# Patient Record
Sex: Male | Born: 1950 | Hispanic: Yes | Marital: Married | Smoking: Former smoker
Health system: Southern US, Community
[De-identification: ages and names within clinical notes are randomized; demographics above are authoritative.]

## PROBLEM LIST (undated history)

## (undated) DIAGNOSIS — E119 Type 2 diabetes mellitus without complications: Secondary | ICD-10-CM

## (undated) DIAGNOSIS — Z8639 Personal history of other endocrine, nutritional and metabolic disease: Secondary | ICD-10-CM

## (undated) DIAGNOSIS — I639 Cerebral infarction, unspecified: Secondary | ICD-10-CM

## (undated) DIAGNOSIS — E785 Hyperlipidemia, unspecified: Secondary | ICD-10-CM

## (undated) DIAGNOSIS — I2510 Atherosclerotic heart disease of native coronary artery without angina pectoris: Secondary | ICD-10-CM

## (undated) DIAGNOSIS — N400 Enlarged prostate without lower urinary tract symptoms: Secondary | ICD-10-CM

## (undated) DIAGNOSIS — N184 Chronic kidney disease, stage 4 (severe): Secondary | ICD-10-CM

## (undated) DIAGNOSIS — I10 Essential (primary) hypertension: Secondary | ICD-10-CM

## (undated) DIAGNOSIS — K3184 Gastroparesis: Secondary | ICD-10-CM

## (undated) HISTORY — PX: CORONARY ARTERY BYPASS GRAFT: SHX141

## (undated) HISTORY — PX: ESOPHAGOGASTRODUODENOSCOPY ENDOSCOPY: SHX5814

## (undated) HISTORY — PX: OTHER SURGICAL HISTORY: SHX169

---

## 2020-04-15 ENCOUNTER — Other Ambulatory Visit: Payer: Self-pay

## 2020-04-15 ENCOUNTER — Inpatient Hospital Stay (HOSPITAL_COMMUNITY)
Admission: EM | Admit: 2020-04-15 | Discharge: 2020-04-18 | DRG: 853 | Disposition: A | Discharge status: Home / Self Care | Payer: Medicare Other | Attending: Family Medicine | Admitting: Family Medicine

## 2020-04-15 ENCOUNTER — Encounter (HOSPITAL_COMMUNITY): Payer: Self-pay

## 2020-04-15 DIAGNOSIS — K7400 Hepatic fibrosis, unspecified: Secondary | ICD-10-CM | POA: Diagnosis present

## 2020-04-15 DIAGNOSIS — A419 Sepsis, unspecified organism: Secondary | ICD-10-CM | POA: Diagnosis not present

## 2020-04-15 DIAGNOSIS — N179 Acute kidney failure, unspecified: Secondary | ICD-10-CM | POA: Diagnosis present

## 2020-04-15 DIAGNOSIS — E1165 Type 2 diabetes mellitus with hyperglycemia: Secondary | ICD-10-CM

## 2020-04-15 DIAGNOSIS — Z794 Long term (current) use of insulin: Secondary | ICD-10-CM

## 2020-04-15 DIAGNOSIS — N184 Chronic kidney disease, stage 4 (severe): Secondary | ICD-10-CM | POA: Insufficient documentation

## 2020-04-15 DIAGNOSIS — Z79890 Hormone replacement therapy (postmenopausal): Secondary | ICD-10-CM

## 2020-04-15 DIAGNOSIS — K3580 Unspecified acute appendicitis: Secondary | ICD-10-CM

## 2020-04-15 DIAGNOSIS — R739 Hyperglycemia, unspecified: Secondary | ICD-10-CM

## 2020-04-15 DIAGNOSIS — Z79899 Other long term (current) drug therapy: Secondary | ICD-10-CM

## 2020-04-15 DIAGNOSIS — E119 Type 2 diabetes mellitus without complications: Secondary | ICD-10-CM

## 2020-04-15 DIAGNOSIS — Z20822 Contact with and (suspected) exposure to covid-19: Secondary | ICD-10-CM | POA: Diagnosis present

## 2020-04-15 DIAGNOSIS — I700 Atherosclerosis of aorta: Secondary | ICD-10-CM | POA: Diagnosis present

## 2020-04-15 DIAGNOSIS — G9341 Metabolic encephalopathy: Secondary | ICD-10-CM | POA: Diagnosis present

## 2020-04-15 DIAGNOSIS — E222 Syndrome of inappropriate secretion of antidiuretic hormone: Secondary | ICD-10-CM | POA: Diagnosis present

## 2020-04-15 DIAGNOSIS — I2510 Atherosclerotic heart disease of native coronary artery without angina pectoris: Secondary | ICD-10-CM | POA: Diagnosis present

## 2020-04-15 DIAGNOSIS — E669 Obesity, unspecified: Secondary | ICD-10-CM | POA: Diagnosis present

## 2020-04-15 DIAGNOSIS — I5032 Chronic diastolic (congestive) heart failure: Secondary | ICD-10-CM | POA: Diagnosis present

## 2020-04-15 DIAGNOSIS — Z8673 Personal history of transient ischemic attack (TIA), and cerebral infarction without residual deficits: Secondary | ICD-10-CM

## 2020-04-15 DIAGNOSIS — E1143 Type 2 diabetes mellitus with diabetic autonomic (poly)neuropathy: Secondary | ICD-10-CM | POA: Diagnosis present

## 2020-04-15 DIAGNOSIS — E1122 Type 2 diabetes mellitus with diabetic chronic kidney disease: Secondary | ICD-10-CM | POA: Diagnosis present

## 2020-04-15 DIAGNOSIS — E8881 Metabolic syndrome: Secondary | ICD-10-CM | POA: Diagnosis present

## 2020-04-15 DIAGNOSIS — I255 Ischemic cardiomyopathy: Secondary | ICD-10-CM | POA: Diagnosis present

## 2020-04-15 DIAGNOSIS — K2210 Ulcer of esophagus without bleeding: Secondary | ICD-10-CM | POA: Diagnosis present

## 2020-04-15 DIAGNOSIS — R6520 Severe sepsis without septic shock: Secondary | ICD-10-CM | POA: Diagnosis present

## 2020-04-15 DIAGNOSIS — K219 Gastro-esophageal reflux disease without esophagitis: Secondary | ICD-10-CM

## 2020-04-15 DIAGNOSIS — D6959 Other secondary thrombocytopenia: Secondary | ICD-10-CM | POA: Diagnosis present

## 2020-04-15 DIAGNOSIS — E039 Hypothyroidism, unspecified: Secondary | ICD-10-CM | POA: Diagnosis present

## 2020-04-15 DIAGNOSIS — E785 Hyperlipidemia, unspecified: Secondary | ICD-10-CM | POA: Diagnosis present

## 2020-04-15 DIAGNOSIS — N400 Enlarged prostate without lower urinary tract symptoms: Secondary | ICD-10-CM

## 2020-04-15 DIAGNOSIS — I130 Hypertensive heart and chronic kidney disease with heart failure and stage 1 through stage 4 chronic kidney disease, or unspecified chronic kidney disease: Secondary | ICD-10-CM | POA: Diagnosis present

## 2020-04-15 DIAGNOSIS — R7989 Other specified abnormal findings of blood chemistry: Secondary | ICD-10-CM

## 2020-04-15 HISTORY — DX: Hyperlipidemia, unspecified: E78.5

## 2020-04-15 HISTORY — DX: Benign prostatic hyperplasia without lower urinary tract symptoms: N40.0

## 2020-04-15 HISTORY — DX: Essential (primary) hypertension: I10

## 2020-04-15 HISTORY — DX: Cerebral infarction, unspecified: I63.9

## 2020-04-15 HISTORY — DX: Chronic kidney disease, stage 4 (severe): N18.4

## 2020-04-15 HISTORY — DX: Gastroparesis: K31.84

## 2020-04-15 HISTORY — DX: Type 2 diabetes mellitus without complications: E11.9

## 2020-04-15 HISTORY — DX: Personal history of other endocrine, nutritional and metabolic disease: Z86.39

## 2020-04-15 HISTORY — DX: Atherosclerotic heart disease of native coronary artery without angina pectoris: I25.10

## 2020-04-15 LAB — CBG MONITORING, ED: Glucose-Capillary: 464 mg/dL — ABNORMAL HIGH (ref 70–99)

## 2020-04-15 MED ORDER — INSULIN ASPART 100 UNIT/ML ~~LOC~~ SOLN
8.0000 [IU] | Freq: Once | SUBCUTANEOUS | Status: AC
  Administered 2020-04-15: 8 [IU] via SUBCUTANEOUS
  Filled 2020-04-15: qty 0.08

## 2020-04-15 MED ORDER — SODIUM CHLORIDE 0.9 % IV BOLUS
1000.0000 mL | Freq: Once | INTRAVENOUS | Status: AC
  Administered 2020-04-15: 1000 mL via INTRAVENOUS

## 2020-04-15 MED ORDER — ACETAMINOPHEN 325 MG PO TABS
650.0000 mg | ORAL_TABLET | Freq: Once | ORAL | Status: AC
  Administered 2020-04-15: 650 mg via ORAL
  Filled 2020-04-15: qty 2

## 2020-04-15 NOTE — ED Triage Notes (Addendum)
Pt BIB EMS from home. Pt lives with family. Family reports pt was shaky and confused. Pt is diabetic. Family checked blood sugar, reports it was over 400. Family gave 20 units of Tresiba at 1815. Family reports pt had a fever and family gave 1000mg  of tylenol at 1900. Family reports it helped but pt still running fever. EMS reports pt not confused on scene, pt denies SOB, denies pain.  103.0 112/68 100 HR 100% RA CBG 600  20G L AC

## 2020-04-15 NOTE — ED Provider Notes (Signed)
[facility] DEPT Provider Note   CSN: [id number] Arrival date & time: 04/15/20  2200     History Chief Complaint  Patient presents with   Hyperglycemia   Fever    Caleb Barrera is a 69 y.o. male who presents to the ED via EMS for hyperglycemia. Per triage report EMS was called due to pt being "shaky and confused." Family checked pt's blood sugar and reports it was over 400. He was given 20 units Tresiba at 6:15 PM. Family also checked pt's temp at gave him 1,000 mg Tylenol around 7 PM. On EMS arrival pt's CBG 600. During recheck in the ED 464.   Pt reports he began having diffuse abdominal pain around 2-3 PM today after eating beans and 2 tamales. The rest of his family ate the same thing and is not having any symptoms. Pt is from [place] and is here visiting. He has been vaccinated for COVID 19. He states he is no longer having abdominal pain. Denies nausea, vomiting, diarrhea. Last normal bowel movement yesterday. He denies missing any doses of his insulin. Pt has no complaints currently and states he feels well.   The history is provided by the patient and medical records.       History reviewed. No pertinent past medical history.  There are no problems to display for this patient.   History reviewed. No pertinent surgical history.     History reviewed. No pertinent family history.  Social History   Tobacco Use   Smoking status: Not on file  Substance Use Topics   Alcohol use: Not on file   Drug use: Not on file    Home Medications Prior to Admission medications   Medication Sig Start Date End Date Taking? Authorizing Provider  carvedilol (COREG) 12.5 MG tablet Take 12.5 mg by mouth 2 (two) times daily with a meal.   Yes [provider]  furosemide (LASIX) 40 MG tablet Take 40 mg by mouth daily.   Yes [provider]  insulin degludec (TRESIBA FLEXTOUCH) 200 UNIT/ML FlexTouch Pen Inject 20 Units into  the skin.   Yes [provider]  levothyroxine (SYNTHROID) 100 MCG tablet Take 100 mcg by mouth daily before breakfast.   Yes [provider]  metoCLOPramide (REGLAN) 10 MG tablet Take 10 mg by mouth in the morning and at bedtime.   Yes [provider]  mirabegron ER (MYRBETRIQ) 50 MG TB24 tablet Take 50 mg by mouth daily.   Yes [provider]  NIFEdipine (PROCARDIA-XL/NIFEDICAL-XL) 30 MG 24 hr tablet Take 30 mg by mouth daily.   Yes [provider]  olmesartan (BENICAR) 20 MG tablet Take 20 mg by mouth daily.   Yes [provider]  pantoprazole (PROTONIX) 40 MG tablet Take 40 mg by mouth daily.   Yes [provider]  pregabalin (LYRICA) 50 MG capsule Take 50 mg by mouth 2 (two) times daily.   Yes [provider]  rosuvastatin (CRESTOR) 20 MG tablet Take 20 mg by mouth daily.   Yes [provider]  tamsulosin (FLOMAX) 0.4 MG CAPS capsule Take 0.8 mg by mouth daily.   Yes [provider]    Allergies    Patient has no known allergies.  Review of Systems   Review of Systems  Constitutional: Positive for chills and fever.  Respiratory: Negative for cough and shortness of breath.   Cardiovascular: Negative for chest pain.  Gastrointestinal: Positive for abdominal pain. Negative for blood in  stool, constipation, diarrhea, nausea and vomiting.  Endocrine: Positive for polyuria.  Genitourinary: Negative for difficulty urinating.  All other systems reviewed and are negative.   Physical Exam Updated Vital Signs BP (!) 112/46    Pulse 94    Temp (!) 101 F (38.3 C) (Oral)    Resp (!) 23    SpO2 95%   Physical Exam Vitals and nursing note reviewed.  Constitutional:      Appearance: He is not ill-appearing or diaphoretic.  HENT:     Head: Normocephalic and atraumatic.  Eyes:     Extraocular Movements: Extraocular movements intact.     Conjunctiva/sclera: Conjunctivae normal.     Pupils: Pupils are  equal, round, and reactive to light.  Cardiovascular:     Rate and Rhythm: Normal rate and regular rhythm.     Pulses: Normal pulses.  Pulmonary:     Effort: Pulmonary effort is normal.     Breath sounds: Normal breath sounds. No wheezing, rhonchi or rales.  Abdominal:     Palpations: Abdomen is soft.     Tenderness: There is abdominal tenderness. There is no guarding or rebound.     Comments: Soft, mild periumbilical abdominal TTP, +BS throughout, no r/g/r, neg murphy's, neg mcburney's, no CVA TTP  Musculoskeletal:     Cervical back: Neck supple.  Skin:    General: Skin is warm and dry.  Neurological:     Mental Status: He is alert.     Comments: CN 3-12 grossly intact A&O x4 GCS 15 Sensation and strength intact Gait nonataxic including with tandem walking Coordination with finger-to-nose WNL Neg romberg, neg pronator drift     ED Results / Procedures / Treatments   Labs (all labs ordered are listed, but only abnormal results are displayed) Labs Reviewed  COMPREHENSIVE METABOLIC PANEL - Abnormal; Notable for the following components:      Result Value   Sodium 131 (*)    Chloride 97 (*)    Glucose, Bld 420 (*)    BUN 75 (*)    Creatinine, Ser 3.12 (*)    Calcium 8.2 (*)    Total Protein 6.1 (*)    Albumin 3.3 (*)    AST 14 (*)    GFR calc non Af Amer 19 (*)    GFR calc Af Amer 22 (*)    All other components within normal limits  CBC WITH DIFFERENTIAL/PLATELET - Abnormal; Notable for the following components:   WBC 18.8 (*)    RBC 3.25 (*)    Hemoglobin 9.7 (*)    HCT 28.7 (*)    Platelets 120 (*)    Neutro Abs 16.4 (*)    Monocytes Absolute 1.4 (*)    Abs Immature Granulocytes 0.16 (*)    All other components within normal limits  URINALYSIS, ROUTINE W REFLEX MICROSCOPIC - Abnormal; Notable for the following components:   Color, Urine STRAW (*)    Glucose, UA >=500 (*)    Hgb urine dipstick SMALL (*)    Protein, ur 30 (*)    Bacteria, UA RARE (*)    All  other components within normal limits  CBG MONITORING, ED - Abnormal; Notable for the following components:   Glucose-Capillary 464 (*)    All other components within normal limits  CBG MONITORING, ED - Abnormal; Notable for the following components:   Glucose-Capillary 117 (*)    All other components within normal limits  SARS CORONAVIRUS 2 BY RT PCR (HOSPITAL ORDER, PERFORMED IN  [facility])  CULTURE, BLOOD (ROUTINE X 2)  CULTURE, BLOOD (ROUTINE X 2)  LIPASE, BLOOD  BLOOD GAS, VENOUS  LACTIC ACID, PLASMA  LACTIC ACID, PLASMA  TYPE AND SCREEN    EKG EKG Interpretation  Date/Time:  Thursday Apr 15 2020 22:17:53 EDT Ventricular Rate:  96 PR Interval:    QRS Duration: 93 QT Interval:  341 QTC Calculation: 431 R Axis:   16 Text Interpretation: Accelerated junctional rhythm No previous ECGs available Confirmed by Shanon Rosser [phone]) on 04/15/2020 11:26:22 PM   Radiology CT Abdomen Pelvis Wo Contrast  Result Date: 04/16/2020 CLINICAL DATA:  Acute nonlocalized abdominal pain with fever. EXAM: CT ABDOMEN AND PELVIS WITHOUT CONTRAST TECHNIQUE: Multidetector CT imaging of the abdomen and pelvis was performed following the standard protocol without IV contrast. COMPARISON:  None. FINDINGS: Lower chest: Moderate hiatal hernia containing stomach, fat, and trace ascitic fluid. Extensive right coronary atherosclerotic calcification. Hepatobiliary: No focal liver abnormality.Cholelithiasis without inflammation or ductal dilatation by CT. Pancreas: Unremarkable. Spleen: Unremarkable. Adrenals/Urinary Tract: Negative adrenals. No hydronephrosis or stone. Symmetric renal atrophy. Unremarkable bladder. Stomach/Bowel: Thickened appendix with tiny appendicolith on reformats and mesoappendiceal fat stranding. Outer wall diameter is 12 mm on coronal reformats. The appendix extends inferiorly from the cecum, which is over the mid right flank. Mild for age colonic diverticulosis.  No bowel  obstruction. Vascular/Lymphatic: Diffuse atherosclerotic calcification. No mass or adenopathy. Reproductive:Symmetric enlargement of the prostate. Other: No ascites or pneumoperitoneum. Musculoskeletal: Spondylosis and facet arthropathy. IMPRESSION: 1. Acute appendicitis with tiny appendicolith. No abscess/visible perforation. 2. Cholelithiasis. 3.  Aortic Atherosclerosis (ICD10-I70.0). Electronically Signed   By: Monte Fantasia M.D.   On: 04/16/2020 05:23    Procedures .Critical Care Performed by: Eustaquio Maize, PA-C Authorized by: Eustaquio Maize, PA-C   Critical care provider statement:    Critical care time (minutes):  45   Critical care was necessary to treat or prevent imminent or life-threatening deterioration of the following conditions:  [place]   Critical care was time spent personally by me on the following activities:  Discussions with consultants, evaluation of patient's response to treatment, examination of patient, ordering and performing treatments and interventions, ordering and review of laboratory studies, ordering and review of radiographic studies, pulse oximetry, re-evaluation of patient's condition, obtaining history from patient or surrogate and review of old charts   (including critical care time)  Medications Ordered in ED Medications  iohexol (OMNIPAQUE) 9 MG/ML oral solution 500 mL (1,000 mLs Oral Contrast Given 04/16/20 0223)  acetaminophen (TYLENOL) tablet 650 mg (650 mg Oral Given 04/15/20 2350)  sodium chloride 0.9 % bolus 1,000 mL (0 mLs Intravenous Stopped 04/16/20 0136)  insulin aspart (novoLOG) injection 8 Units (8 Units Subcutaneous Given 04/15/20 2354)  piperacillin-tazobactam (ZOSYN) IVPB 3.375 g (0 g Intravenous Stopped 04/16/20 0539)    ED Course  I have reviewed the triage vital signs and the nursing notes.  Pertinent labs & imaging results that were available during my care of the patient were reviewed by me and considered in my medical decision  making (see chart for details).  Clinical Course as of Apr 16 624  Thu Apr 15, 2020  2255 Glucose-Capillary(!): 464 [MV]  Fri Apr 16, 2020  0212 Creatinine(!): 3.12 [MV]  0304 WBC(!): 18.8 [MV]  0355 Glucose-Capillary(!): 117 [MV]    Clinical Course User Index [MV] Eustaquio Maize, PA-C   MDM Rules/Calculators/A&P  69 year old Spanish-speaking male who presents to the ED today with complaint of hyperglycemia and fever.  Reports he was having some abdominal pain prior to arrival however this has dissipated.  No nausea or vomiting or diarrhea.  On arrival to the ED patient is noted to have a fever of 101.  He is nontachycardic and nontachypneic.  He appears comfortable.  He has very mild periumbilical abdominal pain.  CBG was 464 arrival to the ED after 20 units of Tresiba.  Work-up for hyperglycemia as well as abdominal pain and fever.  Patient has been vaccinated for COVID-19 however will test for this today.  He is currently visiting from [facility].  Will provide fluids and 8 units of insulin and reassess.  Will obtain VBG to rule out DKA.   CMP with glucose 420, bicarb 25.  No gap.  Of note creatinine is 3.12 and GFR 19.  Nephew is in the room with patient and states he believes he does have chronic kidney disease however patient is unaware of what stage he is at.  Will attempt to get records from [place].  Already receiving fluids.  Potassium stable at 4.5. Lipase 24 VBG without findings of DKA  CBC has returned with a leukocytosis of 18.8. Given earlier abdominal pain will obtain CT scan. Given creatinine cannot obtain with contrast; will do noncon CT. Will obtain blood cultures and empirically start on abx for intraabdominal infection.  COVID test negative  CT scan with acute appendicitis. Pt continues to be comfortable without significant abdominal TTP. Still working on getting pt's records but will consult general surgery at this time. Per nephew pt is not  anticoagulated. Last ate around 3 PM yesterday.   6:25 AM Discussed case with general surgery; morning team will come evaluate patient. Recommends medicine admission.   7:25 AM Discussed case with [facility] who agrees to accept patient for admission.   Final Clinical Impression(s) / ED Diagnoses Final diagnoses:  Acute appendicitis, unspecified acute appendicitis type  Hyperglycemia  Elevated serum creatinine    Rx / DC Orders ED Discharge Orders    None       Eustaquio Maize, PA-C 04/16/20 0725    Daleen Bo, MD 04/16/20 1302

## 2020-04-16 ENCOUNTER — Emergency Department (HOSPITAL_COMMUNITY): Payer: Medicare Other

## 2020-04-16 ENCOUNTER — Encounter (HOSPITAL_COMMUNITY): Payer: Self-pay | Admitting: Internal Medicine

## 2020-04-16 ENCOUNTER — Inpatient Hospital Stay (HOSPITAL_COMMUNITY): Payer: Medicare Other

## 2020-04-16 DIAGNOSIS — A419 Sepsis, unspecified organism: Secondary | ICD-10-CM

## 2020-04-16 DIAGNOSIS — N179 Acute kidney failure, unspecified: Secondary | ICD-10-CM

## 2020-04-16 DIAGNOSIS — N400 Enlarged prostate without lower urinary tract symptoms: Secondary | ICD-10-CM

## 2020-04-16 DIAGNOSIS — Z01810 Encounter for preprocedural cardiovascular examination: Secondary | ICD-10-CM | POA: Diagnosis not present

## 2020-04-16 DIAGNOSIS — E1165 Type 2 diabetes mellitus with hyperglycemia: Secondary | ICD-10-CM | POA: Diagnosis present

## 2020-04-16 DIAGNOSIS — K219 Gastro-esophageal reflux disease without esophagitis: Secondary | ICD-10-CM | POA: Diagnosis present

## 2020-04-16 DIAGNOSIS — K7400 Hepatic fibrosis, unspecified: Secondary | ICD-10-CM | POA: Diagnosis present

## 2020-04-16 DIAGNOSIS — K3580 Unspecified acute appendicitis: Secondary | ICD-10-CM | POA: Diagnosis present

## 2020-04-16 DIAGNOSIS — I340 Nonrheumatic mitral (valve) insufficiency: Secondary | ICD-10-CM | POA: Diagnosis not present

## 2020-04-16 DIAGNOSIS — E119 Type 2 diabetes mellitus without complications: Secondary | ICD-10-CM

## 2020-04-16 DIAGNOSIS — E669 Obesity, unspecified: Secondary | ICD-10-CM | POA: Diagnosis present

## 2020-04-16 DIAGNOSIS — Z20822 Contact with and (suspected) exposure to covid-19: Secondary | ICD-10-CM | POA: Diagnosis present

## 2020-04-16 DIAGNOSIS — E222 Syndrome of inappropriate secretion of antidiuretic hormone: Secondary | ICD-10-CM | POA: Diagnosis present

## 2020-04-16 DIAGNOSIS — I351 Nonrheumatic aortic (valve) insufficiency: Secondary | ICD-10-CM | POA: Diagnosis not present

## 2020-04-16 DIAGNOSIS — K2210 Ulcer of esophagus without bleeding: Secondary | ICD-10-CM | POA: Diagnosis present

## 2020-04-16 DIAGNOSIS — R739 Hyperglycemia, unspecified: Secondary | ICD-10-CM | POA: Diagnosis present

## 2020-04-16 DIAGNOSIS — Z794 Long term (current) use of insulin: Secondary | ICD-10-CM

## 2020-04-16 DIAGNOSIS — I700 Atherosclerosis of aorta: Secondary | ICD-10-CM | POA: Diagnosis present

## 2020-04-16 DIAGNOSIS — I255 Ischemic cardiomyopathy: Secondary | ICD-10-CM | POA: Diagnosis present

## 2020-04-16 DIAGNOSIS — N184 Chronic kidney disease, stage 4 (severe): Secondary | ICD-10-CM | POA: Insufficient documentation

## 2020-04-16 DIAGNOSIS — I130 Hypertensive heart and chronic kidney disease with heart failure and stage 1 through stage 4 chronic kidney disease, or unspecified chronic kidney disease: Secondary | ICD-10-CM | POA: Diagnosis present

## 2020-04-16 DIAGNOSIS — E8881 Metabolic syndrome: Secondary | ICD-10-CM | POA: Diagnosis present

## 2020-04-16 DIAGNOSIS — I5032 Chronic diastolic (congestive) heart failure: Secondary | ICD-10-CM | POA: Diagnosis present

## 2020-04-16 DIAGNOSIS — I2510 Atherosclerotic heart disease of native coronary artery without angina pectoris: Secondary | ICD-10-CM | POA: Diagnosis present

## 2020-04-16 DIAGNOSIS — R6520 Severe sepsis without septic shock: Secondary | ICD-10-CM

## 2020-04-16 DIAGNOSIS — E785 Hyperlipidemia, unspecified: Secondary | ICD-10-CM | POA: Diagnosis present

## 2020-04-16 DIAGNOSIS — E039 Hypothyroidism, unspecified: Secondary | ICD-10-CM | POA: Diagnosis present

## 2020-04-16 DIAGNOSIS — E1143 Type 2 diabetes mellitus with diabetic autonomic (poly)neuropathy: Secondary | ICD-10-CM | POA: Diagnosis present

## 2020-04-16 DIAGNOSIS — G9341 Metabolic encephalopathy: Secondary | ICD-10-CM | POA: Diagnosis present

## 2020-04-16 DIAGNOSIS — E1122 Type 2 diabetes mellitus with diabetic chronic kidney disease: Secondary | ICD-10-CM | POA: Diagnosis present

## 2020-04-16 LAB — URINALYSIS, ROUTINE W REFLEX MICROSCOPIC
Bilirubin Urine: NEGATIVE
Glucose, UA: >=500 mg/dL — AB
Ketones, ur: NEGATIVE mg/dL
Leukocytes,Ua: NEGATIVE
Nitrite: NEGATIVE
Protein, ur: 30 mg/dL — AB
Specific Gravity, Urine: 1.009 (ref 1.005–1.030)
pH: 5 (ref 5.0–8.0)

## 2020-04-16 LAB — COMPREHENSIVE METABOLIC PANEL
ALT: 25 U/L (ref 0–44)
AST: 14 U/L — ABNORMAL LOW (ref 15–41)
Albumin: 3.3 g/dL — ABNORMAL LOW (ref 3.5–5.0)
Alkaline Phosphatase: 82 U/L (ref 38–126)
Anion gap: 9 (ref 5–15)
BUN: 75 mg/dL — ABNORMAL HIGH (ref 8–23)
CO2: 25 mmol/L (ref 22–32)
Calcium: 8.2 mg/dL — ABNORMAL LOW (ref 8.9–10.3)
Chloride: 97 mmol/L — ABNORMAL LOW (ref 98–111)
Creatinine, Ser: 3.12 mg/dL — ABNORMAL HIGH (ref 0.61–1.24)
GFR calc Af Amer: 22 mL/min — ABNORMAL LOW (ref >60)
GFR calc non Af Amer: 19 mL/min — ABNORMAL LOW (ref >60)
Glucose, Bld: 420 mg/dL — ABNORMAL HIGH (ref 70–99)
Potassium: 4.5 mmol/L (ref 3.5–5.1)
Sodium: 131 mmol/L — ABNORMAL LOW (ref 135–145)
Total Bilirubin: 0.4 mg/dL (ref 0.3–1.2)
Total Protein: 6.1 g/dL — ABNORMAL LOW (ref 6.5–8.1)

## 2020-04-16 LAB — CBC WITH DIFFERENTIAL/PLATELET
Abs Immature Granulocytes: 0.16 10*3/uL — ABNORMAL HIGH (ref 0.00–0.07)
Basophils Absolute: 0 10*3/uL (ref 0.0–0.1)
Basophils Relative: 0 %
Eosinophils Absolute: 0.2 10*3/uL (ref 0.0–0.5)
Eosinophils Relative: 1 %
HCT: 28.7 % — ABNORMAL LOW (ref 39.0–52.0)
Hemoglobin: 9.7 g/dL — ABNORMAL LOW (ref 13.0–17.0)
Immature Granulocytes: 1 %
Lymphocytes Relative: 4 %
Lymphs Abs: 0.7 10*3/uL (ref 0.7–4.0)
MCH: 29.8 pg (ref 26.0–34.0)
MCHC: 33.8 g/dL (ref 30.0–36.0)
MCV: 88.3 fL (ref 80.0–100.0)
Monocytes Absolute: 1.4 10*3/uL — ABNORMAL HIGH (ref 0.1–1.0)
Monocytes Relative: 8 %
Neutro Abs: 16.4 10*3/uL — ABNORMAL HIGH (ref 1.7–7.7)
Neutrophils Relative %: 86 %
Platelets: 120 10*3/uL — ABNORMAL LOW (ref 150–400)
RBC: 3.25 MIL/uL — ABNORMAL LOW (ref 4.22–5.81)
RDW: 12 % (ref 11.5–15.5)
WBC: 18.8 10*3/uL — ABNORMAL HIGH (ref 4.0–10.5)
nRBC: 0 % (ref 0.0–0.2)

## 2020-04-16 LAB — CBC
HCT: 30.3 % — ABNORMAL LOW (ref 39.0–52.0)
Hemoglobin: 9.9 g/dL — ABNORMAL LOW (ref 13.0–17.0)
MCH: 29.4 pg (ref 26.0–34.0)
MCHC: 32.7 g/dL (ref 30.0–36.0)
MCV: 89.9 fL (ref 80.0–100.0)
Platelets: 119 10*3/uL — ABNORMAL LOW (ref 150–400)
RBC: 3.37 MIL/uL — ABNORMAL LOW (ref 4.22–5.81)
RDW: 12.2 % (ref 11.5–15.5)
WBC: 17.9 10*3/uL — ABNORMAL HIGH (ref 4.0–10.5)
nRBC: 0 % (ref 0.0–0.2)

## 2020-04-16 LAB — BLOOD GAS, VENOUS
Acid-Base Excess: 0.2 mmol/L (ref 0.0–2.0)
Bicarbonate: 25.4 mmol/L (ref 20.0–28.0)
O2 Saturation: 67.7 %
Patient temperature: 98.6
pCO2, Ven: 45.3 mmHg (ref 44.0–60.0)
pH, Ven: 7.367 (ref 7.250–7.430)
pO2, Ven: 38.9 mmHg (ref 32.0–45.0)

## 2020-04-16 LAB — ECHOCARDIOGRAM COMPLETE: Weight: 2304 oz

## 2020-04-16 LAB — ABO/RH: ABO/RH(D): O POS

## 2020-04-16 LAB — CREATININE, SERUM
Creatinine, Ser: 2.73 mg/dL — ABNORMAL HIGH (ref 0.61–1.24)
GFR calc Af Amer: 26 mL/min — ABNORMAL LOW (ref >60)
GFR calc non Af Amer: 23 mL/min — ABNORMAL LOW (ref >60)

## 2020-04-16 LAB — HEMOGLOBIN A1C
Hgb A1c MFr Bld: 9.4 % — ABNORMAL HIGH (ref 4.8–5.6)
Mean Plasma Glucose: 223.08 mg/dL

## 2020-04-16 LAB — TYPE AND SCREEN
ABO/RH(D): O POS
Antibody Screen: NEGATIVE

## 2020-04-16 LAB — LACTIC ACID, PLASMA
Lactic Acid, Venous: 1.1 mmol/L (ref 0.5–1.9)
Lactic Acid, Venous: 1.3 mmol/L (ref 0.5–1.9)

## 2020-04-16 LAB — CBG MONITORING, ED
Glucose-Capillary: 117 mg/dL — ABNORMAL HIGH (ref 70–99)
Glucose-Capillary: 51 mg/dL — ABNORMAL LOW (ref 70–99)
Glucose-Capillary: 191 mg/dL — ABNORMAL HIGH (ref 70–99)
Glucose-Capillary: 143 mg/dL — ABNORMAL HIGH (ref 70–99)

## 2020-04-16 LAB — GLUCOSE, CAPILLARY
Glucose-Capillary: 65 mg/dL — ABNORMAL LOW (ref 70–99)
Glucose-Capillary: 117 mg/dL — ABNORMAL HIGH (ref 70–99)

## 2020-04-16 LAB — SURGICAL PCR SCREEN
MRSA, PCR: NEGATIVE
Staphylococcus aureus: NEGATIVE

## 2020-04-16 LAB — SARS CORONAVIRUS 2 BY RT PCR (HOSPITAL ORDER, PERFORMED IN ~~LOC~~ HOSPITAL LAB): SARS Coronavirus 2: NEGATIVE

## 2020-04-16 LAB — HIV ANTIBODY (ROUTINE TESTING W REFLEX): HIV Screen 4th Generation wRfx: NONREACTIVE

## 2020-04-16 LAB — LIPASE, BLOOD: Lipase: 24 U/L (ref 11–51)

## 2020-04-16 MED ORDER — ONDANSETRON HCL 4 MG/2ML IJ SOLN: 4.0000 mg | Freq: Four times a day (QID) | INTRAMUSCULAR | Status: DC | PRN

## 2020-04-16 MED ORDER — PIPERACILLIN-TAZOBACTAM 3.375 G IVPB
3.3750 g | Freq: Three times a day (TID) | INTRAVENOUS | Status: DC
  Administered 2020-04-16 – 2020-04-18 (×5): 3.375 g via INTRAVENOUS
  Filled 2020-04-16 (×6): qty 50

## 2020-04-16 MED ORDER — SENNOSIDES-DOCUSATE SODIUM 8.6-50 MG PO TABS: 1.0000 | ORAL_TABLET | Freq: Every evening | ORAL | Status: DC | PRN

## 2020-04-16 MED ORDER — CARVEDILOL 12.5 MG PO TABS
12.5000 mg | ORAL_TABLET | Freq: Two times a day (BID) | ORAL | Status: DC
  Administered 2020-04-16 – 2020-04-18 (×5): 12.5 mg via ORAL
  Filled 2020-04-16 (×5): qty 1

## 2020-04-16 MED ORDER — ACETAMINOPHEN 500 MG PO TABS: 1000.0000 mg | ORAL_TABLET | ORAL | Status: AC

## 2020-04-16 MED ORDER — METOCLOPRAMIDE HCL 10 MG PO TABS
10.0000 mg | ORAL_TABLET | Freq: Two times a day (BID) | ORAL | Status: DC
  Administered 2020-04-17 – 2020-04-18 (×2): 10 mg via ORAL
  Filled 2020-04-16 (×2): qty 1

## 2020-04-16 MED ORDER — PREGABALIN 50 MG PO CAPS
50.0000 mg | ORAL_CAPSULE | Freq: Two times a day (BID) | ORAL | Status: DC
  Administered 2020-04-17 – 2020-04-18 (×2): 50 mg via ORAL
  Filled 2020-04-16 (×3): qty 1

## 2020-04-16 MED ORDER — PIPERACILLIN-TAZOBACTAM 3.375 G IVPB 30 MIN
3.3750 g | Freq: Once | INTRAVENOUS | Status: AC
  Administered 2020-04-16: 3.375 g via INTRAVENOUS
  Filled 2020-04-16: qty 50

## 2020-04-16 MED ORDER — DEXTROSE 50 % IV SOLN
12.5000 g | INTRAVENOUS | Status: AC
  Administered 2020-04-16: 12.5 g via INTRAVENOUS
  Filled 2020-04-16: qty 50

## 2020-04-16 MED ORDER — MIRABEGRON ER 25 MG PO TB24
50.0000 mg | ORAL_TABLET | Freq: Every day | ORAL | Status: DC
  Administered 2020-04-18: 50 mg via ORAL
  Filled 2020-04-16 (×2): qty 2

## 2020-04-16 MED ORDER — LEVOTHYROXINE SODIUM 100 MCG PO TABS
100.0000 ug | ORAL_TABLET | Freq: Every day | ORAL | Status: DC
  Administered 2020-04-18: 100 ug via ORAL
  Filled 2020-04-16: qty 1

## 2020-04-16 MED ORDER — HEPARIN SODIUM (PORCINE) 5000 UNIT/ML IJ SOLN: 5000.0000 [IU] | Freq: Three times a day (TID) | INTRAMUSCULAR | Status: DC

## 2020-04-16 MED ORDER — HYDRALAZINE HCL 20 MG/ML IJ SOLN: 10.0000 mg | Freq: Four times a day (QID) | INTRAMUSCULAR | Status: DC | PRN

## 2020-04-16 MED ORDER — INSULIN ASPART 100 UNIT/ML ~~LOC~~ SOLN
0.0000 [IU] | Freq: Four times a day (QID) | SUBCUTANEOUS | Status: DC
  Filled 2020-04-16: qty 0.15

## 2020-04-16 MED ORDER — GABAPENTIN 300 MG PO CAPS
300.0000 mg | ORAL_CAPSULE | ORAL | Status: AC
  Administered 2020-04-16: 300 mg via ORAL
  Filled 2020-04-16: qty 1

## 2020-04-16 MED ORDER — ACETAMINOPHEN 325 MG PO TABS: 650.0000 mg | ORAL_TABLET | Freq: Four times a day (QID) | ORAL | Status: DC | PRN

## 2020-04-16 MED ORDER — IOHEXOL 9 MG/ML PO SOLN
ORAL | Status: AC
  Administered 2020-04-16: 1000 mL via ORAL
  Filled 2020-04-16: qty 1000

## 2020-04-16 MED ORDER — INSULIN ASPART 100 UNIT/ML ~~LOC~~ SOLN
0.0000 [IU] | Freq: Four times a day (QID) | SUBCUTANEOUS | Status: DC
  Administered 2020-04-17: 5 [IU] via SUBCUTANEOUS
  Administered 2020-04-17 – 2020-04-18 (×2): 1 [IU] via SUBCUTANEOUS
  Administered 2020-04-18: 4 [IU] via SUBCUTANEOUS
  Filled 2020-04-16: qty 0.06

## 2020-04-16 MED ORDER — ACETAMINOPHEN 650 MG RE SUPP: 650.0000 mg | Freq: Four times a day (QID) | RECTAL | Status: DC | PRN

## 2020-04-16 MED ORDER — DEXTROSE-NACL 5-0.9 % IV SOLN: INTRAVENOUS | Status: DC

## 2020-04-16 MED ORDER — PANTOPRAZOLE SODIUM 40 MG PO TBEC
40.0000 mg | DELAYED_RELEASE_TABLET | Freq: Every day | ORAL | Status: DC
  Administered 2020-04-18: 40 mg via ORAL
  Filled 2020-04-16: qty 1

## 2020-04-16 MED ORDER — TAMSULOSIN HCL 0.4 MG PO CAPS
0.8000 mg | ORAL_CAPSULE | Freq: Every day | ORAL | Status: DC
  Administered 2020-04-17 – 2020-04-18 (×2): 0.8 mg via ORAL
  Filled 2020-04-16 (×2): qty 2

## 2020-04-16 MED ORDER — PIPERACILLIN-TAZOBACTAM 3.375 G IVPB 30 MIN: 3.3750 g | Freq: Three times a day (TID) | INTRAVENOUS | Status: DC

## 2020-04-16 MED ORDER — IOHEXOL 9 MG/ML PO SOLN: 500.0000 mL | ORAL | Status: AC

## 2020-04-16 MED ORDER — SODIUM CHLORIDE 0.9 % IV SOLN: INTRAVENOUS | Status: DC

## 2020-04-16 MED ORDER — ONDANSETRON HCL 4 MG PO TABS: 4.0000 mg | ORAL_TABLET | Freq: Four times a day (QID) | ORAL | Status: DC | PRN

## 2020-04-16 MED ORDER — INSULIN DETEMIR 100 UNIT/ML ~~LOC~~ SOLN: 10.0000 [IU] | Freq: Every day | SUBCUTANEOUS | Status: DC

## 2020-04-16 MED ORDER — ROSUVASTATIN CALCIUM 20 MG PO TABS
20.0000 mg | ORAL_TABLET | Freq: Every day | ORAL | Status: DC
  Administered 2020-04-18: 20 mg via ORAL
  Filled 2020-04-16 (×2): qty 1

## 2020-04-16 MED ORDER — DEXTROSE 50 % IV SOLN
1.0000 | Freq: Once | INTRAVENOUS | Status: AC
  Administered 2020-04-16: 50 mL via INTRAVENOUS
  Filled 2020-04-16: qty 50

## 2020-04-16 MED ORDER — PIPERACILLIN-TAZOBACTAM IN DEX 2-0.25 GM/50ML IV SOLN: 2.2500 g | Freq: Four times a day (QID) | INTRAVENOUS | Status: DC

## 2020-04-16 MED ORDER — FAMOTIDINE IN NACL 20-0.9 MG/50ML-% IV SOLN
20.0000 mg | INTRAVENOUS | Status: DC
  Administered 2020-04-16: 20 mg via INTRAVENOUS
  Filled 2020-04-16: qty 50

## 2020-04-16 NOTE — Progress Notes (Signed)
Pharmacy Antibiotic Note  Caleb Barrera is a 69 y.o. male admitted on 04/15/2020 with acute appendicitis.  Pharmacy has been consulted for Zosyn dosing.  Plan: Zosyn 3.375g IV q8h (4 hour infusion).   F/U renal function and plans for antibiotics   Weight: 65.3 kg (144 lb)  Temp (24hrs), Avg:99.8 F (37.7 C), Min:98.5 F (36.9 C), Max:101 F (38.3 C)  Recent Labs  Lab 04/15/20 2338 04/16/20 0214  WBC 18.8*  --   CREATININE 3.12*  --   LATICACIDVEN 1.3 1.1    CrCl cannot be calculated (Unknown ideal weight.).    No Known Allergies  Antimicrobials this admission: 5/14 Zosyn >>   Dose adjustments this admission:  Microbiology results: 5/14 BCx: pending 5/13 COVID: neg  Thank you for allowing pharmacy to be a part of this patient's care.  Ulice Dash D 04/16/2020 10:05 AM

## 2020-04-16 NOTE — Progress Notes (Signed)
Please contact son by cell: [phone] (Ram)

## 2020-04-16 NOTE — ED Provider Notes (Signed)
  Face-to-face evaluation   History: Patient's family called EMS because his blood sugar was high.  Earlier, he had been shaking, so they gave him some Tylenol.  He was given extra [place] at home tonight and EMS found his blood sugar to be high.  Patient complains of abdominal pain.  He also takes insulin.  Physical exam: Elderly, alert and cooperative.  He appears comfortable.  Abdomen soft and nontender to palpation.  There is no dysarthria or aphasia.  No respiratory distress.  Medical screening examination/treatment/procedure(s) were conducted as a shared visit with non-physician practitioner(s) and myself.  I personally evaluated the patient during the encounter    Daleen Bo, MD 04/16/20 1302

## 2020-04-16 NOTE — H&P (Signed)
History and Physical    Caleb Barrera QJF:[id number] DOB: 1951-05-19 DOA: 04/15/2020  PCP: Patient, No Pcp Per   Patient coming from: Home ( from [place] visiting family in [place])  Chief Complaint: Abdominal pain  HPI: Caleb Barrera is a 69 y.o. male with medical history significant for for diabetes mellitus on insulin, CKD unknown stage, ICM with RCA disease, chronic diastolic CHF hypertension, hyperlipidemia, hypothyroidism, obesity metabolic syndrome, liver fibrosis, hx of Stroke, hx of cardiac surgery for valve mass currently visiting from [place] brought to ED for hyperglycemia, "shaky and confused" episode.  Family noticed that patient was shaky and confused, blood sugar was over 400, 20 needs Tarceva given at 6:15 PM, check temperature and gave you 1000 mg Tylenol around 7 PM on arrival patient's blood sugar was in 600 during ED 464.  Patient was also complaining of diffuse abdominal pain around 2:58 PM 04/15/20 after eating beans and 2 Tamales.  Patient had his Covid vaccine 2 doses. Wife at bedside and used spanish interpretaor. Currently denies abdominal pain. Patient otherwise denies any nausea, vomiting, chest pain, shortness of breath, fever, chills, headache, focal weakness, numbness tingling, speech difficulties.  ED Course: Febrile 101, otherwise hemodynamically stable, blood work shows leukocytosis 18.8, lactic acid 1.3, 1.1, stable UA, blood glucose in 420s normal anion gap, BUN/creatinine elevated 75/3.12,  CT abdomen pelvis without contrast showed acute appendicitis with tiny appendicolith no abscess.  General surgery was consulted advised admission under medical service  [place] able [place] query to care everywhere records after getting his home address in the epic he had  SPECT images and LHC on 03/27/2013 Impression: -Abnormal myocardial perfusion scan with: -large in size, moderate in severity, fixed basal to mid inferior perfusion defect,  and  -a moderate in size and severity, reversible distal inferior and inferoapical perfusion defect.  -There is also a small in size, mild to moderate in severity, Partially reversible basal lateral perfusion defect.  -Another moderate in size, mild in severity, mostly fixed, anterior perfusion defect. The inferior defect might be, in part, due to the gut artifact. Mild LV systolic dysfunction with mild global hypokinesis.  "LHC: 03/27/13 Findings:  coronary dominance: right  left main: large, normal  LAD: large, normal  LCX: proximal LI  RCA: Ostial 80-90% stenosis, proximal 30-40%, mid- LI, distal 20-30%"  Subsequently he had elective PCI on proximal and ostial  RCA in 06/30/2013 (available in care everywhere) and discharged on 12 months of plavix  Records reviewed-he had EGD 03/09/20 and had Grade C Erosive esophagitis- was placed on dexilant 60 mg bid, He had labs done on 03/08/20: BUN/Creat appears to be 56/2.6  Review of Systems: All systems were reviewed and were negative except as mentioned in HPI above. Negative for chest pain Negative for shortness of breath  Past medical history: CKD, liver fibrosis, metabolic syndrome, DM on insulin, Stroke, ?heart stent  Past surgical history: Heart surgery for cyst, edg/colonoscopy  Social History  has no history on file for tobacco, alcohol, and drug.  No Known Allergies  Family History History reviewed. No pertinent family history.   Prior to Admission medications   Medication Sig Start Date End Date Taking? Authorizing Provider  carvedilol (COREG) 12.5 MG tablet Take 12.5 mg by mouth 2 (two) times daily with a meal.   Yes [provider]  furosemide (LASIX) 40 MG tablet Take 40 mg by mouth daily.   Yes [provider]  insulin degludec (TRESIBA FLEXTOUCH) 200 UNIT/ML  FlexTouch Pen Inject 20 Units into the skin.   Yes [provider]  levothyroxine (SYNTHROID) 100 MCG tablet Take 100 mcg by mouth daily  before breakfast.   Yes [provider]  metoCLOPramide (REGLAN) 10 MG tablet Take 10 mg by mouth in the morning and at bedtime.   Yes [provider]  mirabegron ER (MYRBETRIQ) 50 MG TB24 tablet Take 50 mg by mouth daily.   Yes [provider]  NIFEdipine (PROCARDIA-XL/NIFEDICAL-XL) 30 MG 24 hr tablet Take 30 mg by mouth daily.   Yes [provider]  olmesartan (BENICAR) 20 MG tablet Take 20 mg by mouth daily.   Yes [provider]  pantoprazole (PROTONIX) 40 MG tablet Take 40 mg by mouth daily.   Yes [provider]  pregabalin (LYRICA) 50 MG capsule Take 50 mg by mouth 2 (two) times daily.   Yes [provider]  rosuvastatin (CRESTOR) 20 MG tablet Take 20 mg by mouth daily.   Yes [provider]  tamsulosin (FLOMAX) 0.4 MG CAPS capsule Take 0.8 mg by mouth daily.   Yes [provider]    Physical Exam: Vitals:   04/16/20 0800 04/16/20 0815 04/16/20 0830 04/16/20 0845  BP: (!) 120/56 132/60 (!) 135/53 (!) 138/54  Pulse: 67 69 67 66  Resp: 17 16 16 13   Temp:      TempSrc:      SpO2: 97% 98% 99% 99%  Weight:        General exam: AAOx3, on RA, pleasant. HEENT:Oral mucosa moist, Ear/Nose WNL grossly, dentition normal. Respiratory system: bilaterally clear,no wheezing or crackles,no use of accessory muscle.  Well-healed sternotomy incision. Cardiovascular system: S1 & S2 +, No JVD,. Gastrointestinal system: Abdomen soft, TTP right lower quadrant,ND, BS+ Nervous System:Alert, awake, moving extremities and grossly nonfocal Extremities: no leg edema, distal peripheral pulses palpable.  Skin: No rashes,no icterus. MSK: Normal muscle bulk,tone, power   Labs on Admission: I have personally reviewed following labs and imaging studies  CBC: Recent Labs  Lab 04/15/20 2338  WBC 18.8*  NEUTROABS 16.4*  HGB 9.7*  HCT 28.7*  MCV 88.3  PLT 469*   Basic Metabolic Panel: Recent Labs  Lab 04/15/20 2338  NA  131*  K 4.5  CL 97*  CO2 25  GLUCOSE 420*  BUN 75*  CREATININE 3.12*  CALCIUM 8.2*   GFR: CrCl cannot be calculated (Unknown ideal weight.). Liver Function Tests: Recent Labs  Lab 04/15/20 2338  AST 14*  ALT 25  ALKPHOS 82  BILITOT 0.4  PROT 6.1*  ALBUMIN 3.3*   Recent Labs  Lab 04/15/20 2338  LIPASE 24   No results for input(s): AMMONIA in the last 168 hours. Coagulation Profile: No results for input(s): INR, PROTIME in the last 168 hours. Cardiac Enzymes: No results for input(s): CKTOTAL, CKMB, CKMBINDEX, TROPONINI in the last 168 hours. BNP (last 3 results) No results for input(s): PROBNP in the last 8760 hours. HbA1C: No results for input(s): HGBA1C in the last 72 hours. CBG: Recent Labs  Lab 04/15/20 2227 04/16/20 0347  GLUCAP 464* 117*   Lipid Profile: No results for input(s): CHOL, HDL, LDLCALC, TRIG, CHOLHDL, LDLDIRECT in the last 72 hours. Thyroid Function Tests: No results for input(s): TSH, T4TOTAL, FREET4, T3FREE, THYROIDAB in the last 72 hours. Anemia Panel: No results for input(s): VITAMINB12, FOLATE, FERRITIN, TIBC, IRON, RETICCTPCT in the last 72 hours. Urine analysis:    Component Value Date/Time   COLORURINE STRAW (A) 04/15/2020 2338  APPEARANCEUR CLEAR 04/15/2020 2338   LABSPEC 1.009 04/15/2020 2338   PHURINE 5.0 04/15/2020 2338   GLUCOSEU >=500 (A) 04/15/2020 2338   HGBUR SMALL (A) 04/15/2020 2338   [place] 04/15/2020 2338   [place] 04/15/2020 2338   PROTEINUR 30 (A) 04/15/2020 2338   NITRITE NEGATIVE 04/15/2020 [place] 04/15/2020 2338    Radiological Exams on Admission: CT Abdomen Pelvis Wo Contrast  Result Date: 04/16/2020 CLINICAL DATA:  Acute nonlocalized abdominal pain with fever. EXAM: CT ABDOMEN AND PELVIS WITHOUT CONTRAST TECHNIQUE: Multidetector CT imaging of the abdomen and pelvis was performed following the standard protocol without IV contrast. COMPARISON:  None. FINDINGS:  Lower chest: Moderate hiatal hernia containing stomach, fat, and trace ascitic fluid. Extensive right coronary atherosclerotic calcification. Hepatobiliary: No focal liver abnormality.Cholelithiasis without inflammation or ductal dilatation by CT. Pancreas: Unremarkable. Spleen: Unremarkable. Adrenals/Urinary Tract: Negative adrenals. No hydronephrosis or stone. Symmetric renal atrophy. Unremarkable bladder. Stomach/Bowel: Thickened appendix with tiny appendicolith on reformats and mesoappendiceal fat stranding. Outer wall diameter is 12 mm on coronal reformats. The appendix extends inferiorly from the cecum, which is over the mid right flank. Mild for age colonic diverticulosis.  No bowel obstruction. Vascular/Lymphatic: Diffuse atherosclerotic calcification. No mass or adenopathy. Reproductive:Symmetric enlargement of the prostate. Other: No ascites or pneumoperitoneum. Musculoskeletal: Spondylosis and facet arthropathy. IMPRESSION: 1. Acute appendicitis with tiny appendicolith. No abscess/visible perforation. 2. Cholelithiasis. 3.  Aortic Atherosclerosis (ICD10-I70.0). Electronically Signed   By: Monte Fantasia M.D.   On: 04/16/2020 05:23    Assessment/Plan  Sepsis POA with acute appendicitis, patient presented with fever 101, leukocytosis of 18k but vitals are stable, normal lactic acid.  CT shows acute appendicitis surgery has been notified await for further plan in the meantime we will keep him on IV antibiotics with IV Zosyn, keep n.p.o. IV fluids.  Plan as per general surgery.  Surgery requesting preop clearance, cardiology consulted for preop eval in anticipation of surgery today- spoke w/ Cardiology PA- defer need for echo etcs to cards, appears to be high risk. wil defer to surgery team for further plan.Wife reports patient's heart had to be "shocked" " because of too much anesthesia in endoscopy" abt 3 yrs ago. Of note he did successfully undergo EGD in April and son reports he had pre-op eval from  his cardio.  ICM/ RCA disease: he had PCI on proximal and ostial  RCA in 06/30/2013 (available in care everywhere) and discharged on 12 months of plavix-not sure abt his f/u cardiac history history. No show in office at [facility] cardiology in1/21/2015.  Chronic diastolic CHF on Lasix at home:Currently no shortness of breath, not on oxygen and compensated, has mild left leg swelling.I will hold Lasix for now as n.p.o and he has elevated bun/creat.  Adding gentle IV fluids. Monitor for for fluid overload.Resume Lasix postop.  Diabetes mellitus on long-term insulin with Tyler Aas 2o units/with neuropathy, uncontrolled, blood sugar in mid 600> 400. check A1c and adjust insulin based on CBG.  Also on Reglan 10 mg twice daily, Lyricaat home- cont same.  Last blood sugar has improved to 117. He took his home doseTresiba last night. Sugar further decreasing  hypoglycemic- will give dextrose iv, will note resume lantus as NPO, place on SSI Q6H  AKI on CKD Stage IV: Followed by Nephro in [place].In care everywhere last creatinine  In  2019 shows creatinine 2.4-2.8 and 3.08 in 08/2019 from paper chart review. BUN 55 indicating CKD, bun/creat 56/2.6 on 03/09/20, today 75/3.12. Suspect AKI  on CKD. Creat on repeat improved to 2.7. Cont in gentle ivf while npo, hold benicar and lasix today. Renally dose medications.   Grade C Erosive esophagitis:had EGD in April for that.  We will continue on PPI  Hyponatremia : from hyperglycemia  Thrombocytopenia:appears mild likely from his liver fibrosis.  SIADH hx-monitor sodium.  Confusion wth acute metabolic encephalopathy likely from sepsis.  Mental status improved at this time.  Patient had episode of confusion at home which prompted ED visit.  Continue supportive care fall precaution.  Essential hypertension, on Coreg, Benicar, nifedipine and Lasix, continue Coreg for now.  Resume rest of the medication slowly postop. Add as needed hydralazine.  Hyperlipidemia, cont his statins  post op.  History of a stroke from "mass in heart" and he- had surgery for that in 2008. No residual weakness. Cont statins.  Liver fibrosis followed by hepatology in [place]. lfts stable.  Hypothyroidism on Synthroid  Enlarged prostate/Urinary issues on Flomax and Myrbetriq  Plan of care discussed with patient,wife and his son- SON would like Surgeon to talk to him before the procedure. He understands that his father is a high risk for procedure.  There is no height or weight on file to calculate BMI.   Severity of Illness:  * I certify that at the point of admission it is my clinical judgment that the patient will require inpatient hospital care spanning beyond 2 midnights from the point of admission due to high intensity of service, high risk for further deterioration and high frequency of surveillance required.*    DVT prophylaxis:  SCD. Start heparin post procedure if okay with surgery.  Code Status: Full code Family Communication: Admission, patients condition and plan of care including tests being ordered have been discussed with the patient and hie wife who indicate understanding and agree with the plan and Code Status. I called his son and also updated.  Consults called:  General surgery Cardiology  Antonieta Pert MD [facility]  If 7PM-7AM, please contact night-coverage [email or web address]  04/16/2020, 9:19 AM

## 2020-04-16 NOTE — Consult Note (Addendum)
Cardiology Consultation:   Patient ID: Caleb Barrera; [id number]; Apr 04, 1951   Admit date: 04/15/2020 Date of Consult: 04/16/2020  Primary Care Provider: Patient, No Pcp Per Primary Cardiologist: Remotely Dr. Narda Bonds in [place], [place], though does not appear he has been seen since 2015 Primary Electrophysiologist:  None   Patient Profile:   Caleb Barrera is a 69 y.o. male with a PMH of CAD s/p PCI/DES x2 to ostial-proximal RCA in 2014 and possible CABG?, HTN, HLD, DM type 2 on insulin, CVA, CKD stage 4, and tobacco abuse, who is being seen today for the evaluation of preoperative assessment at the request of Dr. Lupita Leash.  History of Present Illness:   [name] is a spanish speaking gentleman visiting [place] from [place]. History was obtained with the assistance of Interpreter [phone] and wife at bedside. He was in his usual state of health until 04/15/20 when he began experiencing abdominal pain which progressively worsened throughout the day prompting him to present to the ED for further evaluation. Also noted to have chills, fevers, and confusion at home. He was found to have acute appendicitis on CT A/P this admission. General surgery evaluated the patient with plans to undergo an appendectomy later today pending cardiology evaluation.   Patient follows with Dr. Claude Manges at [facility], last seen ~3 months ago. Does not appear that these records are available in [place]. The last cardiology records available to review in [place] are from 2014. He had an abnormal stress test in 2014 which revealed a large, moderate severity, fixed basal-mid inferior perfusion defect with moderate reversible distal inferior and inferoapical perfusion defect possibly representing gut artifact, as well as partially reversible basal lateral perfusion defect and mild, mostly fixed, anterior perfusion defect. He was noted to have mild LV systolic dysfunction with  mild global hypokinesis. He subsequently underwent a cardiac catheterization and was found to have severe RCA stenosis managed with PCI/DES x2 to ostial-proximal RCA - there is no comment on any non-obstructive disease on the outpatient notes reviewed in Care Everywhere. It appears the patient was seen outpatient later in 2014 and recommended to continue aspirin, plavix, and carvedilol for management of recent CAD with PCI, and he was continued on pravastatin, imdur, lasix, losartan, and amlodipine. His care was then picked up by Dr. Claude Manges. He was subsequently taken off of aspirin and plavix due to anemia. EGD 03/2020 with erosive gastritis, recommended for BID dexilant.   He was seen by his cardiologist 3 months ago and reported doing well from a cardiac standpoint. He denies history of CABG, though does have a midline scar which the wife states was from a cyst removal in his chest that was affecting his brain? On review of outside records there is mention of CABG. Wife reported recent complications with anesthesia during EGD 3 years ago requiring "shocks to his heart" per the Wife. In recent weeks/months the patient denies any trouble with chest pain or SOB. He can easily complete 4 METs without anginal complaints. He denies trouble with LE edema, orthopnea, PND, dizziness, lightheadedness, syncope, or palpitations.   Hospital course: febrile to 101 on arrival, intermittently hypertensive, otherwise VSS. Labs notable for Na 131, K 4.5, Cr 3.12>2.73 after IVFs (2.83 in 2019), WBC 18.8, Hgb 9.7 (12.9 in 2019), PLT 120 (baseline), lactate wnl. EKG with sinus rhythm with 1st degree AV block, rate 96 bpm, isolated TWI in aVL, no STE/D. CT A/P showed acute appendicitis with tiny appendicolith, cholelithiasis, and aortic atherosclerosis.  Past Medical History:  Diagnosis Date  . BPH (benign prostatic hyperplasia)   . CKD (chronic kidney disease), stage IV ([place])   . Coronary artery disease   . Diabetes  mellitus without complication ([place])   . Gastroparesis   . History of SIADH   . Hyperlipidemia   . Hypertension   . Stroke [facility])     Past Surgical History:  Procedure Laterality Date  . CORONARY ARTERY BYPASS GRAFT     x 1   . ESOPHAGOGASTRODUODENOSCOPY ENDOSCOPY    . Resection of external cardiac tumor       Home Medications:  Prior to Admission medications   Medication Sig Start Date End Date Taking? Authorizing Provider  carvedilol (COREG) 12.5 MG tablet Take 12.5 mg by mouth 2 (two) times daily with a meal.   Yes [provider]  furosemide (LASIX) 40 MG tablet Take 40 mg by mouth daily.   Yes [provider]  insulin degludec (TRESIBA FLEXTOUCH) 200 UNIT/ML FlexTouch Pen Inject 20 Units into the skin.   Yes [provider]  levothyroxine (SYNTHROID) 100 MCG tablet Take 100 mcg by mouth daily before breakfast.   Yes [provider]  metoCLOPramide (REGLAN) 10 MG tablet Take 10 mg by mouth in the morning and at bedtime.   Yes [provider]  mirabegron ER (MYRBETRIQ) 50 MG TB24 tablet Take 50 mg by mouth daily.   Yes [provider]  NIFEdipine (PROCARDIA-XL/NIFEDICAL-XL) 30 MG 24 hr tablet Take 30 mg by mouth daily.   Yes [provider]  olmesartan (BENICAR) 20 MG tablet Take 20 mg by mouth daily.   Yes [provider]  pantoprazole (PROTONIX) 40 MG tablet Take 40 mg by mouth daily.   Yes [provider]  pregabalin (LYRICA) 50 MG capsule Take 50 mg by mouth 2 (two) times daily.   Yes [provider]  rosuvastatin (CRESTOR) 20 MG tablet Take 20 mg by mouth daily.   Yes [provider]  tamsulosin (FLOMAX) 0.4 MG CAPS capsule Take 0.8 mg by mouth daily.   Yes [provider]    Inpatient Medications: Scheduled Meds: . acetaminophen  1,000 mg Oral On Call to OR  . carvedilol  12.5 mg Oral BID WC  . insulin aspart  0-6 Units Subcutaneous Q6H  . levothyroxine  100 mcg  Oral QAC breakfast  . [START ON 04/17/2020] metoCLOPramide  10 mg Oral BID  . [START ON 04/17/2020] mirabegron ER  50 mg Oral Daily  . pregabalin  50 mg Oral BID  . rosuvastatin  20 mg Oral Daily  . [START ON 04/17/2020] tamsulosin  0.8 mg Oral Daily   Continuous Infusions: . sodium chloride 75 mL/hr at 04/16/20 1013  . famotidine (PEPCID) IV Stopped (04/16/20 1152)  . piperacillin-tazobactam (ZOSYN)  IV 3.375 g (04/16/20 1219)   PRN Meds: acetaminophen **OR** acetaminophen, hydrALAZINE, ondansetron **OR** ondansetron (ZOFRAN) IV, senna-docusate  Allergies:   No Known Allergies  Social History:   Social History   Socioeconomic History  . Marital status: Married    Spouse name: Not on file  . Number of children: Not on file  . Years of education: Not on file  . Highest education level: Not on file  Occupational History  . Not on file  Tobacco Use  . Smoking status: Former Smoker    Years: 30.00    Types: Cigarettes  . Smokeless tobacco: Never Used  Substance and Sexual Activity  . Alcohol use: Yes  Comment: 1-2 beers per month  . Drug use: Never  . Sexual activity: Not Currently  Other Topics Concern  . Not on file  Social History Narrative  . Not on file   Social Determinants of Health   Financial Resource Strain:   . Difficulty of Paying Living Expenses:   Food Insecurity:   . Worried About Charity fundraiser in the Last Year:   . Arboriculturist in the Last Year:   Transportation Needs:   . Film/video editor (Medical):   Marland Kitchen Lack of Transportation (Non-Medical):   Physical Activity:   . Days of Exercise per Week:   . Minutes of Exercise per Session:   Stress:   . Feeling of Stress :   Social Connections:   . Frequency of Communication with Friends and Family:   . Frequency of Social Gatherings with Friends and Family:   . Attends Religious Services:   . Active Member of Clubs or Organizations:   . Attends Archivist Meetings:   Marland Kitchen Marital  Status:   Intimate Partner Violence:   . Fear of Current or Ex-Partner:   . Emotionally Abused:   Marland Kitchen Physically Abused:   . Sexually Abused:     Family History:    Family History  Problem Relation Age of Onset  . Diabetes Father      ROS:  Please see the history of present illness.  ROS  All other ROS reviewed and negative.     Physical Exam/Data:   Vitals:   04/16/20 1000 04/16/20 1030 04/16/20 1100 04/16/20 1220  BP: (!) 137/54 (!) 132/50 (!) 133/50 (!) 121/42  Pulse: 64 68 67 73  Resp: 12 15 13    Temp:      TempSrc:      SpO2: 100% 99% 97%   Weight:        Intake/Output Summary (Last 24 hours) at 04/16/2020 1239 Last data filed at 04/16/2020 1152 Gross per 24 hour  Intake 1049.94 ml  Output 1000 ml  Net 49.94 ml   Filed Weights   04/16/20 0352  Weight: 65.3 kg   There is no height or weight on file to calculate BMI.  General:  Well nourished, well developed, in no acute distress HEENT: sclera anicteric  Neck: no JVD Vascular: No carotid bruits; distal pulses 2+ bilaterally Cardiac:  Distant heart sounds, normal S1, S2; RRR; no obvious murmurs, rubs, or gallops; old midline scar Lungs:  clear to auscultation bilaterally, no wheezing, rhonchi or rales  Abd: NABS, soft, obese, mild TTP in RLQ, no hepatomegaly Ext: 1+ LE edema Musculoskeletal:  No deformities, BUE and BLE strength normal and equal Skin: warm and dry  Neuro:  CNs 2-12 intact, no focal abnormalities noted Psych:  Normal affect   EKG:  The EKG was personally reviewed and demonstrates:  sinus rhythm with 1st degree AV block, rate 96 bpm, isolated TWI in aVL, no STE/D. Telemetry:  Telemetry was personally reviewed and demonstrates:  NSR  Relevant CV Studies: NST 2016: OVERALL IMPRESSION:   1.  No evidence of inducible ischemia.   2.  Normal wall motion.   3. Normal left ventricular systolic function.   LHC: 03/27/13 Findings:  coronary dominance: right  left main: large, normal  LAD:  large, normal  LCX: proximal LI  RCA: Ostial 80-90% stenosis, proximal 30-40%, mid- LI, distal 20-30%  Laboratory Data:  Chemistry Recent Labs  Lab 04/15/20 2338 04/16/20 1151  NA 131*  --  K 4.5  --   CL 97*  --   CO2 25  --   GLUCOSE 420*  --   BUN 75*  --   CREATININE 3.12* 2.73*  CALCIUM 8.2*  --   GFRNONAA 19* 23*  GFRAA 22* 26*  ANIONGAP 9  --     Recent Labs  Lab 04/15/20 2338  PROT 6.1*  ALBUMIN 3.3*  AST 14*  ALT 25  ALKPHOS 82  BILITOT 0.4   Hematology Recent Labs  Lab 04/15/20 2338 04/16/20 1151  WBC 18.8* 17.9*  RBC 3.25* 3.37*  HGB 9.7* 9.9*  HCT 28.7* 30.3*  MCV 88.3 89.9  MCH 29.8 29.4  MCHC 33.8 32.7  RDW 12.0 12.2  PLT 120* 119*   Cardiac EnzymesNo results for input(s): TROPONINI in the last 168 hours. No results for input(s): TROPIPOC in the last 168 hours.  BNPNo results for input(s): BNP, PROBNP in the last 168 hours.  DDimer No results for input(s): DDIMER in the last 168 hours.  Radiology/Studies:  CT Abdomen Pelvis Wo Contrast  Result Date: 04/16/2020 CLINICAL DATA:  Acute nonlocalized abdominal pain with fever. EXAM: CT ABDOMEN AND PELVIS WITHOUT CONTRAST TECHNIQUE: Multidetector CT imaging of the abdomen and pelvis was performed following the standard protocol without IV contrast. COMPARISON:  None. FINDINGS: Lower chest: Moderate hiatal hernia containing stomach, fat, and trace ascitic fluid. Extensive right coronary atherosclerotic calcification. Hepatobiliary: No focal liver abnormality.Cholelithiasis without inflammation or ductal dilatation by CT. Pancreas: Unremarkable. Spleen: Unremarkable. Adrenals/Urinary Tract: Negative adrenals. No hydronephrosis or stone. Symmetric renal atrophy. Unremarkable bladder. Stomach/Bowel: Thickened appendix with tiny appendicolith on reformats and mesoappendiceal fat stranding. Outer wall diameter is 12 mm on coronal reformats. The appendix extends inferiorly from the cecum, which is over the mid  right flank. Mild for age colonic diverticulosis.  No bowel obstruction. Vascular/Lymphatic: Diffuse atherosclerotic calcification. No mass or adenopathy. Reproductive:Symmetric enlargement of the prostate. Other: No ascites or pneumoperitoneum. Musculoskeletal: Spondylosis and facet arthropathy. IMPRESSION: 1. Acute appendicitis with tiny appendicolith. No abscess/visible perforation. 2. Cholelithiasis. 3.  Aortic Atherosclerosis (ICD10-I70.0). Electronically Signed   By: Monte Fantasia M.D.   On: 04/16/2020 05:23    Assessment and Plan:   1. Preoperative evaluation: patient presented with abdomina pain, found to have acute appendicitis on CT A/P. General surgery planning to take to the OR later today pending cardiology evaluation. Patient has a history of CAD s/p PCI/DES to RCA in 2014 with ?CABG (according to outside records but denied by patient) vs remote cyst removal (per patient), suspected chronic diastolic CHF (1+ LE edema on exam), CVA (according to outside records), DM type 2 on insulin, and CKD with Cr >2. He has no complaints of chest pain or SOB and is easily able to complete 4 METS without anginal complaints. EKG today is non-ischemic.  - Based on the revised cardiac risk index, patient has a score of 6 (high risk surgery, ischemic heart disease history, CHF, CVA history, DM requiring insulin, and CKD with Cr >2), placing him at a 15% risk of adverse cardiac events in the perioperative setting. - Patient is deemed high risk for surgery.  - Will check an echocardiogram to evaluate LV function  2. CAD s/p PCI/DES x2 to RCA in 2014 +/- CABG: no anginal complaints. Not on aspirin due to recent anemia.  - Continue statin  3. Chronic diastolic CHF: 1+ LE edema on exam today. Lasix initially held due to [place] resuming lasix tomorrow given 1+ LE edema on  exam  4. HTN: BP overall stable . On olmesartan prior to admission despite CKD stage 4.  - Agree with holding olmesartan -  Continue carvedilol - can uptitrate as needed  5. HLD: no recent LDL on file - Continue statin  6. CVA: no recent focal neurologic complaints - Continue statin    For questions or updates, please contact [place] Please consult [email or web address] for contact info under Cardiology/STEMI.   Signed, Abigail Butts, PA-C  04/16/2020 12:39 PM [phone]  Patient seen and examined with Roby Lofts, PA-C.  Agree as above, with the following exceptions and changes as noted below.  This is a pleasant 2-year gentleman who is Spanish-speaking, seen with the assistance of the iPad interpreter, with his wife present in the room.  He previously followed with cardiology in [place].  Presented with abdominal pain felt to have acute appendicitis, pending appendectomy with general surgery.  His past cardiovascular history includes CAD status post PCI as well as possible CABG though history is slightly unclear, he does have evidence of sternotomy by physical exam, no chest x-ray available to review.  He also has a history of hypertension, hyperlipidemia, diabetes, CVA, CKD stage IV, tobacco abuse.  History documented extensively in HPI above.  He walks frequently without angina, and completes 4 METS.  He is not limited by shortness of breath.  He does have some mild lower extremity swelling, and takes furosemide at home.  Ejection fraction unknown at the time of consultation.   Gen: NAD, CV: RRR, no murmurs, chest: Healed sternotomy incision, lungs: clear, Abd: soft, Extrem: Warm, well perfused, 1+ edema, Neuro/Psych: alert and oriented x 3, normal mood and affect. All available labs, radiology testing, previous records reviewed.   The patient is high risk for cardiovascular complication for a intermediate risk procedure.  There was a question as to whether he had ischemic cardiomyopathy on record review, we will obtain an echocardiogram to assess further.  If there is no significant for the heart disease  and biventricular function is normal, no further cardiovascular testing is required prior to the procedure.  If this level of risk is acceptable to the patient and surgical team, the patient should be considered optimized from a cardiovascular standpoint.  ADDENDUM: Echocardiogram shows normal biventricular function with no hemodynamically significant valvular heart disease.  Elevated left ventricular end-diastolic pressure is noted.  This suggests that the patient may need diuresis after surgery is complete in the postoperative period.  The patient should be considered optimized with a nonmodifiable risk level of high risk for MACE.  Elouise Munroe, MD 04/16/20 3:27 PM

## 2020-04-16 NOTE — Progress Notes (Signed)
  Echocardiogram 2D Echocardiogram has been performed.  Caleb Barrera 04/16/2020, 2:13 PM

## 2020-04-16 NOTE — Consult Note (Addendum)
[facility] Surgery Consult Note  Caleb Barrera 09-10-1951  [id number].    Requesting MD: Daleen Bo Chief Complaint/Reason for Consult: appendicitis  HPI:  Caleb Barrera is a 69yo male with multiple medical problems including HTN, DM, HLD, CKD, CAD, and liver fibrosis who presented to [facility] last night complaining of abdominal pain. States that the pain started yesterday morning. It is in his right abdomen. Pain is constant and gradually became worse. Associated with chills and low grade fever. Denies nausea, vomiting, dysuria, chest pain, SOB, cough. The pain would not subside so he came to the ED. ED work up included CT scan which shows acute appendicitis with tiny appendicolith, no abscess/visible perforation. WBC 18.8, lactic acid 1.3, Cr 3.12, glucose 420. General surgery asked to see.  He has had nothing to eat/drink today Of note, patient reports going for EGD about 3 years ago during which his heart had to be shocked "because I had too much anesthesia."  -Abdominal surgical history: none -Anticoagulants: none currently, reports previously being on [facility] for unknown reason but stopped this due to anemia -Nonsmoker -Denies alcohol use -Lives in [place], visiting family in [place]  Review of Systems  Constitutional: Positive for chills.  HENT: Negative.   Eyes: Negative.   Respiratory: Negative.   Cardiovascular: Positive for leg swelling. Negative for chest pain.  Gastrointestinal: Positive for abdominal pain. Negative for constipation, diarrhea, nausea and vomiting.  Genitourinary: Negative.   Musculoskeletal: Negative.   Skin: Negative.   Neurological: Negative.     All systems reviewed and otherwise negative except for as above  History reviewed. No pertinent family history.  History reviewed. No pertinent past medical history.  History reviewed. No pertinent surgical history.  Social History:  has no history on file for tobacco,  alcohol, and drug.  Allergies: No Known Allergies  (Not in a hospital admission)   Prior to Admission medications   Medication Sig Start Date End Date Taking? Authorizing Provider  carvedilol (COREG) 12.5 MG tablet Take 12.5 mg by mouth 2 (two) times daily with a meal.   Yes [provider]  furosemide (LASIX) 40 MG tablet Take 40 mg by mouth daily.   Yes [provider]  insulin degludec (TRESIBA FLEXTOUCH) 200 UNIT/ML FlexTouch Pen Inject 20 Units into the skin.   Yes [provider]  levothyroxine (SYNTHROID) 100 MCG tablet Take 100 mcg by mouth daily before breakfast.   Yes [provider]  metoCLOPramide (REGLAN) 10 MG tablet Take 10 mg by mouth in the morning and at bedtime.   Yes [provider]  mirabegron ER (MYRBETRIQ) 50 MG TB24 tablet Take 50 mg by mouth daily.   Yes [provider]  NIFEdipine (PROCARDIA-XL/NIFEDICAL-XL) 30 MG 24 hr tablet Take 30 mg by mouth daily.   Yes [provider]  olmesartan (BENICAR) 20 MG tablet Take 20 mg by mouth daily.   Yes [provider]  pantoprazole (PROTONIX) 40 MG tablet Take 40 mg by mouth daily.   Yes [provider]  pregabalin (LYRICA) 50 MG capsule Take 50 mg by mouth 2 (two) times daily.   Yes [provider]  rosuvastatin (CRESTOR) 20 MG tablet Take 20 mg by mouth daily.   Yes [provider]  tamsulosin (FLOMAX) 0.4 MG CAPS capsule Take 0.8 mg by mouth daily.   Yes [provider]    Blood pressure (!) 113/50, pulse 65, temperature 98.5 F (36.9 C), temperature source Oral, resp. rate 15, weight 65.3 kg,  SpO2 97 %. Physical Exam: General: pleasant, WD/WN male who is laying in bed in NAD HEENT: head is normocephalic, atraumatic.  Sclera are noninjected.  PERRL.  Ears and nose without any masses or lesions.  Mouth is pink and moist. Dentition fair Heart: regular, rate, and rhythm.  Normal s1,s2. No obvious murmurs, gallops, or  rubs noted.  Palpable pedal pulses bilaterally. Well healed sternotomy incision Lungs: CTAB, no wheezes, rhonchi, or rales noted.  Respiratory effort nonlabored Abd: soft, protuberant, +BS, no masses, hernias, or organomegaly. TTP RLQ with voluntary guarding, no peritonitis MS: calves soft and nontender. 1-2+ pitting edema BLE Skin: warm and dry with no masses, lesions, or rashes Psych: A&Ox4 with an appropriate affect Neuro: cranial nerves grossly intact, equal strength in BUE/BLE bilaterally, normal speech, thought process intact  Results for orders placed or performed during the hospital encounter of 04/15/20 (from the past 48 hour(s))  CBG monitoring, ED     Status: Abnormal   Collection Time: 04/15/20 10:27 PM  Result Value Ref Range   Glucose-Capillary 464 (H) 70 - 99 mg/dL    Comment: Glucose reference range applies only to samples taken after fasting for at least 8 hours.  SARS Coronavirus 2 by RT PCR (hospital order, performed in [facility] hospital lab) Nasopharyngeal Nasopharyngeal Swab     Status: None   Collection Time: 04/15/20 11:38 PM   Specimen: Nasopharyngeal Swab  Result Value Ref Range   SARS Coronavirus 2 NEGATIVE NEGATIVE    Comment: (NOTE) SARS-CoV-2 target nucleic acids are NOT DETECTED. The SARS-CoV-2 RNA is generally detectable in upper and lower respiratory specimens during the acute phase of infection. The lowest concentration of SARS-CoV-2 viral copies this assay can detect is 250 copies / mL. A negative result does not preclude SARS-CoV-2 infection and should not be used as the sole basis for treatment or other patient management decisions.  A negative result may occur with improper specimen collection / handling, submission of specimen other than nasopharyngeal swab, presence of viral mutation(s) within the areas targeted by this assay, and inadequate number of viral copies (<250 copies / mL). A negative result must be combined with  clinical observations, patient history, and epidemiological information. Fact Sheet for Patients:   StrictlyIdeas.no Fact Sheet for Healthcare Providers: BankingDealers.co.za This test is not yet approved or cleared  by the [place] FDA and has been authorized for detection and/or diagnosis of SARS-CoV-2 by FDA under an Emergency Use Authorization (EUA).  This EUA will remain in effect (meaning this test can be used) for the duration of the COVID-19 declaration under Section 564(b)(1) of the Act, 21 U.S.C. section 360bbb-3(b)(1), unless the authorization is terminated or revoked sooner. Performed at [facility], [place] [address] [place], [place] [address]   Comprehensive metabolic panel     Status: Abnormal   Collection Time: 04/15/20 11:38 PM  Result Value Ref Range   Sodium 131 (L) 135 - 145 mmol/L   Potassium 4.5 3.5 - 5.1 mmol/L   Chloride 97 (L) 98 - 111 mmol/L   CO2 25 22 - 32 mmol/L   Glucose, Bld 420 (H) 70 - 99 mg/dL    Comment: Glucose reference range applies only to samples taken after fasting for at least 8 hours.   BUN 75 (H) 8 - 23 mg/dL   Creatinine, Ser 3.12 (H) 0.61 - 1.24 mg/dL   Calcium 8.2 (L) 8.9 - 10.3 mg/dL   Total Protein 6.1 (L) 6.5 - 8.1 g/dL  Albumin 3.3 (L) 3.5 - 5.0 g/dL   AST 14 (L) 15 - 41 U/L   ALT 25 0 - 44 U/L   Alkaline Phosphatase 82 38 - 126 U/L   Total Bilirubin 0.4 0.3 - 1.2 mg/dL   GFR calc non Af Amer 19 (L) >60 mL/min   GFR calc Af Amer 22 (L) >60 mL/min   Anion gap 9 5 - 15    Comment: Performed at [facility], [place] [address], [place], [place] [address]  Lipase, blood     Status: None   Collection Time: 04/15/20 11:38 PM  Result Value Ref Range   Lipase 24 11 - 51 U/L    Comment: Performed at [facility], [place] [address] [place], [place] [address]  CBC with Differential     Status: Abnormal   Collection Time: 04/15/20  11:38 PM  Result Value Ref Range   WBC 18.8 (H) 4.0 - 10.5 K/uL   RBC 3.25 (L) 4.22 - 5.81 MIL/uL   Hemoglobin 9.7 (L) 13.0 - 17.0 g/dL   HCT 28.7 (L) 39.0 - 52.0 %   MCV 88.3 80.0 - 100.0 fL   MCH 29.8 26.0 - 34.0 pg   MCHC 33.8 30.0 - 36.0 g/dL   RDW 12.0 11.5 - 15.5 %   Platelets 120 (L) 150 - 400 K/uL    Comment: Immature Platelet Fraction may be clinically indicated, consider ordering this additional test [id number]    nRBC 0.0 0.0 - 0.2 %   Neutrophils Relative % 86 %   Neutro Abs 16.4 (H) 1.7 - 7.7 K/uL   Lymphocytes Relative 4 %   Lymphs Abs 0.7 0.7 - 4.0 K/uL   Monocytes Relative 8 %   Monocytes Absolute 1.4 (H) 0.1 - 1.0 K/uL   Eosinophils Relative 1 %   Eosinophils Absolute 0.2 0.0 - 0.5 K/uL   Basophils Relative 0 %   Basophils Absolute 0.0 0.0 - 0.1 K/uL   Immature Granulocytes 1 %   Abs Immature Granulocytes 0.16 (H) 0.00 - 0.07 K/uL    Comment: Performed at [facility], [place] [address], [place], [place] [address]  Blood gas, venous (at [facility] and [facility], not at [facility])     Status: None   Collection Time: 04/15/20 11:38 PM  Result Value Ref Range   pH, Ven 7.367 7.250 - 7.430   pCO2, Ven 45.3 44.0 - 60.0 mmHg   pO2, Ven 38.9 32.0 - 45.0 mmHg   Bicarbonate 25.4 20.0 - 28.0 mmol/L   Acid-Base Excess 0.2 0.0 - 2.0 mmol/L   O2 Saturation 67.7 %   Patient temperature 98.6     Comment: Performed at [facility], [place] [address], [place], [place] [address]  Urinalysis, Routine w reflex microscopic     Status: Abnormal   Collection Time: 04/15/20 11:38 PM  Result Value Ref Range   Color, Urine STRAW (A) YELLOW   APPearance CLEAR CLEAR   Specific Gravity, Urine 1.009 1.005 - 1.030   pH 5.0 5.0 - 8.0   Glucose, UA >=500 (A) NEGATIVE mg/dL   Hgb urine dipstick SMALL (A) NEGATIVE   Bilirubin Urine NEGATIVE NEGATIVE   Ketones, ur NEGATIVE NEGATIVE mg/dL   Protein, ur 30 (A) NEGATIVE mg/dL   Nitrite NEGATIVE NEGATIVE   Leukocytes,Ua  NEGATIVE NEGATIVE   WBC, UA 0-5 0 - 5 WBC/hpf   Bacteria, UA RARE (A) NONE SEEN   Mucus PRESENT     Comment: Performed  at [facility], [place] [address] [place], [place] [address]  Lactic acid, plasma     Status: None   Collection Time: 04/15/20 11:38 PM  Result Value Ref Range   Lactic Acid, Venous 1.3 0.5 - 1.9 mmol/L    Comment: Performed at [facility], [place] [address] [place], [place] [address]  Lactic acid, plasma     Status: None   Collection Time: 04/16/20  2:14 AM  Result Value Ref Range   Lactic Acid, Venous 1.1 0.5 - 1.9 mmol/L    Comment: Performed at [facility], [place] [address], [place], [place] [address]  POC CBG, ED     Status: Abnormal   Collection Time: 04/16/20  3:47 AM  Result Value Ref Range   Glucose-Capillary 117 (H) 70 - 99 mg/dL    Comment: Glucose reference range applies only to samples taken after fasting for at least 8 hours.  Culture, blood (routine x 2)     Status: None (Preliminary result)   Collection Time: 04/16/20  5:01 AM   Specimen: BLOOD RIGHT HAND  Result Value Ref Range   Specimen Description BLOOD RIGHT HAND    Special Requests      BOTTLES DRAWN AEROBIC AND ANAEROBIC Blood Culture adequate volume Performed at [place] [address] [place], [place] [address]    Culture PENDING    Report Status PENDING   ABO/Rh     Status: None   Collection Time: 04/16/20  6:00 AM  Result Value Ref Range   ABO/RH(D)      O POS Performed at [facility], [place] [address], [place], [place] [address]   Type and screen [place]     Status: None   Collection Time: 04/16/20  6:05 AM  Result Value Ref Range   ABO/RH(D) O POS    Antibody Screen NEG    Sample Expiration      04/19/2020,2359 Performed at [facility], [place] [address], [place], [place] [address]    CT Abdomen Pelvis Wo Contrast  Result Date:  04/16/2020 CLINICAL DATA:  Acute nonlocalized abdominal pain with fever. EXAM: CT ABDOMEN AND PELVIS WITHOUT CONTRAST TECHNIQUE: Multidetector CT imaging of the abdomen and pelvis was performed following the standard protocol without IV contrast. COMPARISON:  None. FINDINGS: Lower chest: Moderate hiatal hernia containing stomach, fat, and trace ascitic fluid. Extensive right coronary atherosclerotic calcification. Hepatobiliary: No focal liver abnormality.Cholelithiasis without inflammation or ductal dilatation by CT. Pancreas: Unremarkable. Spleen: Unremarkable. Adrenals/Urinary Tract: Negative adrenals. No hydronephrosis or stone. Symmetric renal atrophy. Unremarkable bladder. Stomach/Bowel: Thickened appendix with tiny appendicolith on reformats and mesoappendiceal fat stranding. Outer wall diameter is 12 mm on coronal reformats. The appendix extends inferiorly from the cecum, which is over the mid right flank. Mild for age colonic diverticulosis.  No bowel obstruction. Vascular/Lymphatic: Diffuse atherosclerotic calcification. No mass or adenopathy. Reproductive:Symmetric enlargement of the prostate. Other: No ascites or pneumoperitoneum. Musculoskeletal: Spondylosis and facet arthropathy. IMPRESSION: 1. Acute appendicitis with tiny appendicolith. No abscess/visible perforation. 2. Cholelithiasis. 3.  Aortic Atherosclerosis (ICD10-I70.0). Electronically Signed   By: Monte Fantasia M.D.   On: 04/16/2020 05:23      Assessment/Plan IDDM HLD GERD Hypothyroidism CKD Aortic atherosclerosis  CAD, RCA disease s/p PCI with DES Chronic diastolic CHF Liver fibrosis Hx TIA ?Hx surgery for mass on heart valve  Acute appendicitis - Patient with clinical and radiographic findings consistent with acute appendicitis. CT scan does  not show any sign of abscess or perforation, he does have an appendicolith.  Agree with medical admission for medical optimization prior to surgery. They are planning ECHO and  cardiology consult.  If cleared for surgery will plan to perform this later today. Keep NPO and continue IV zosyn.  ID - zosyn VTE - SCDs FEN - IVF, NPO Foley - none Follow up - TBD  Wellington [place], [facility] Surgery 04/16/2020, 7:42 AM Please see Amion for pager number during day hours 7:00am-4:30pm

## 2020-04-16 NOTE — ED Notes (Addendum)
[place], pt's nephew [phone]) and gave an update on pt's surgery status and the ED's visitor policy. Informed will update again when find out about bed assignment and that floor's visitation policy.

## 2020-04-16 NOTE — ED Notes (Signed)
[place], pt's nephew, giving update on room assignment and that he will be transferred up there momentarily. Updated him on floor's visitor policy being 2 visitors, but has to be the same people throughout the hospital stay and hours are 7a-8p. Verbalized understanding.

## 2020-04-17 ENCOUNTER — Inpatient Hospital Stay (HOSPITAL_COMMUNITY): Payer: Medicare Other | Admitting: Certified Registered Nurse Anesthetist

## 2020-04-17 ENCOUNTER — Encounter (HOSPITAL_COMMUNITY)
Admission: EM | Disposition: A | Discharge status: Home / Self Care | Payer: Self-pay | Source: Home / Self Care | Attending: Family Medicine

## 2020-04-17 DIAGNOSIS — Z794 Long term (current) use of insulin: Secondary | ICD-10-CM

## 2020-04-17 DIAGNOSIS — E1165 Type 2 diabetes mellitus with hyperglycemia: Secondary | ICD-10-CM

## 2020-04-17 DIAGNOSIS — E119 Type 2 diabetes mellitus without complications: Secondary | ICD-10-CM

## 2020-04-17 DIAGNOSIS — R739 Hyperglycemia, unspecified: Secondary | ICD-10-CM

## 2020-04-17 DIAGNOSIS — N184 Chronic kidney disease, stage 4 (severe): Secondary | ICD-10-CM

## 2020-04-17 HISTORY — PX: LAPAROSCOPIC APPENDECTOMY: SHX408

## 2020-04-17 LAB — COMPREHENSIVE METABOLIC PANEL
ALT: 20 U/L (ref 0–44)
AST: 13 U/L — ABNORMAL LOW (ref 15–41)
Albumin: 2.8 g/dL — ABNORMAL LOW (ref 3.5–5.0)
Alkaline Phosphatase: 46 U/L (ref 38–126)
Anion gap: 6 (ref 5–15)
BUN: 46 mg/dL — ABNORMAL HIGH (ref 8–23)
CO2: 26 mmol/L (ref 22–32)
Calcium: 8.3 mg/dL — ABNORMAL LOW (ref 8.9–10.3)
Chloride: 108 mmol/L (ref 98–111)
Creatinine, Ser: 2.46 mg/dL — ABNORMAL HIGH (ref 0.61–1.24)
GFR calc Af Amer: 30 mL/min — ABNORMAL LOW (ref >60)
GFR calc non Af Amer: 26 mL/min — ABNORMAL LOW (ref >60)
Glucose, Bld: 121 mg/dL — ABNORMAL HIGH (ref 70–99)
Potassium: 4.2 mmol/L (ref 3.5–5.1)
Sodium: 140 mmol/L (ref 135–145)
Total Bilirubin: 0.3 mg/dL (ref 0.3–1.2)
Total Protein: 5.6 g/dL — ABNORMAL LOW (ref 6.5–8.1)

## 2020-04-17 LAB — GLUCOSE, CAPILLARY
Glucose-Capillary: 166 mg/dL — ABNORMAL HIGH (ref 70–99)
Glucose-Capillary: 198 mg/dL — ABNORMAL HIGH (ref 70–99)
Glucose-Capillary: 367 mg/dL — ABNORMAL HIGH (ref 70–99)
Glucose-Capillary: 68 mg/dL — ABNORMAL LOW (ref 70–99)
Glucose-Capillary: 68 mg/dL — ABNORMAL LOW (ref 70–99)
Glucose-Capillary: 75 mg/dL (ref 70–99)
Glucose-Capillary: 81 mg/dL (ref 70–99)
Glucose-Capillary: 98 mg/dL (ref 70–99)

## 2020-04-17 LAB — CBC
HCT: 30.7 % — ABNORMAL LOW (ref 39.0–52.0)
Hemoglobin: 9.8 g/dL — ABNORMAL LOW (ref 13.0–17.0)
MCH: 28.8 pg (ref 26.0–34.0)
MCHC: 31.9 g/dL (ref 30.0–36.0)
MCV: 90.3 fL (ref 80.0–100.0)
Platelets: 129 10*3/uL — ABNORMAL LOW (ref 150–400)
RBC: 3.4 MIL/uL — ABNORMAL LOW (ref 4.22–5.81)
RDW: 12.4 % (ref 11.5–15.5)
WBC: 13.6 10*3/uL — ABNORMAL HIGH (ref 4.0–10.5)
nRBC: 0 % (ref 0.0–0.2)

## 2020-04-17 SURGERY — APPENDECTOMY, LAPAROSCOPIC
Anesthesia: General | Site: Abdomen

## 2020-04-17 MED ORDER — MORPHINE SULFATE (PF) 4 MG/ML IV SOLN: 1.0000 mg | INTRAVENOUS | Status: DC | PRN

## 2020-04-17 MED ORDER — ACETAMINOPHEN 325 MG PO TABS
ORAL_TABLET | ORAL | Status: DC | PRN
Start: 2020-04-17 — End: 2020-04-17
  Administered 2020-04-17: 1000 mg via ORAL

## 2020-04-17 MED ORDER — FENTANYL CITRATE (PF) 100 MCG/2ML IJ SOLN
INTRAMUSCULAR | Status: DC | PRN
  Administered 2020-04-17: 100 ug via INTRAVENOUS
  Administered 2020-04-17: 50 ug via INTRAVENOUS

## 2020-04-17 MED ORDER — FENTANYL CITRATE (PF) 100 MCG/2ML IJ SOLN: 25.0000 ug | INTRAMUSCULAR | Status: DC | PRN

## 2020-04-17 MED ORDER — ROCURONIUM BROMIDE 10 MG/ML (PF) SYRINGE
PREFILLED_SYRINGE | INTRAVENOUS | Status: DC | PRN
  Administered 2020-04-17: 30 mg via INTRAVENOUS
  Administered 2020-04-17: 10 mg via INTRAVENOUS

## 2020-04-17 MED ORDER — LIDOCAINE 2% (20 MG/ML) 5 ML SYRINGE
INTRAMUSCULAR | Status: DC | PRN
  Administered 2020-04-17: 60 mg via INTRAVENOUS

## 2020-04-17 MED ORDER — ROCURONIUM BROMIDE 10 MG/ML (PF) SYRINGE
PREFILLED_SYRINGE | INTRAVENOUS | Status: AC
  Filled 2020-04-17: qty 10

## 2020-04-17 MED ORDER — MIDAZOLAM HCL 2 MG/2ML IJ SOLN
INTRAMUSCULAR | Status: AC
  Filled 2020-04-17: qty 2

## 2020-04-17 MED ORDER — FENTANYL CITRATE (PF) 250 MCG/5ML IJ SOLN
INTRAMUSCULAR | Status: AC
  Filled 2020-04-17: qty 5

## 2020-04-17 MED ORDER — PHENYLEPHRINE HCL (PRESSORS) 10 MG/ML IV SOLN
INTRAVENOUS | Status: AC
  Filled 2020-04-17: qty 1

## 2020-04-17 MED ORDER — HYDROCODONE-ACETAMINOPHEN 5-325 MG PO TABS: 1.0000 | ORAL_TABLET | ORAL | Status: DC | PRN

## 2020-04-17 MED ORDER — LACTATED RINGERS IV SOLN: INTRAVENOUS | Status: DC | PRN

## 2020-04-17 MED ORDER — SUCCINYLCHOLINE CHLORIDE 200 MG/10ML IV SOSY
PREFILLED_SYRINGE | INTRAVENOUS | Status: DC | PRN
  Administered 2020-04-17: 100 mg via INTRAVENOUS

## 2020-04-17 MED ORDER — DEXAMETHASONE SODIUM PHOSPHATE 10 MG/ML IJ SOLN
INTRAMUSCULAR | Status: DC | PRN
  Administered 2020-04-17: 4 mg via INTRAVENOUS

## 2020-04-17 MED ORDER — NIFEDIPINE ER OSMOTIC RELEASE 30 MG PO TB24
30.0000 mg | ORAL_TABLET | Freq: Every day | ORAL | Status: DC
  Administered 2020-04-17 – 2020-04-18 (×2): 30 mg via ORAL
  Filled 2020-04-17 (×2): qty 1

## 2020-04-17 MED ORDER — DEXTROSE 50 % IV SOLN
12.5000 g | INTRAVENOUS | Status: AC
  Administered 2020-04-17: 12.5 g via INTRAVENOUS
  Filled 2020-04-17: qty 50

## 2020-04-17 MED ORDER — 0.9 % SODIUM CHLORIDE (POUR BTL) OPTIME
TOPICAL | Status: DC | PRN
  Administered 2020-04-17: 1000 mL

## 2020-04-17 MED ORDER — PROPOFOL 10 MG/ML IV BOLUS
INTRAVENOUS | Status: DC | PRN
  Administered 2020-04-17: 100 mg via INTRAVENOUS

## 2020-04-17 MED ORDER — DEXAMETHASONE SODIUM PHOSPHATE 10 MG/ML IJ SOLN
INTRAMUSCULAR | Status: AC
  Filled 2020-04-17: qty 1

## 2020-04-17 MED ORDER — LACTATED RINGERS IR SOLN
Status: DC | PRN
  Administered 2020-04-17: 1000 mL

## 2020-04-17 MED ORDER — BUPIVACAINE HCL 0.25 % IJ SOLN
INTRAMUSCULAR | Status: AC
  Filled 2020-04-17: qty 1

## 2020-04-17 MED ORDER — ONDANSETRON HCL 4 MG/2ML IJ SOLN
INTRAMUSCULAR | Status: AC
  Filled 2020-04-17: qty 2

## 2020-04-17 MED ORDER — ARTIFICIAL TEARS OPHTHALMIC OINT
TOPICAL_OINTMENT | OPHTHALMIC | Status: AC
  Filled 2020-04-17: qty 3.5

## 2020-04-17 MED ORDER — SUCCINYLCHOLINE CHLORIDE 200 MG/10ML IV SOSY
PREFILLED_SYRINGE | INTRAVENOUS | Status: AC
  Filled 2020-04-17: qty 10

## 2020-04-17 MED ORDER — ONDANSETRON HCL 4 MG/2ML IJ SOLN: 4.0000 mg | Freq: Once | INTRAMUSCULAR | Status: DC | PRN

## 2020-04-17 MED ORDER — ACETAMINOPHEN 500 MG PO TABS
ORAL_TABLET | ORAL | Status: AC
  Filled 2020-04-17: qty 2

## 2020-04-17 MED ORDER — PROPOFOL 10 MG/ML IV BOLUS
INTRAVENOUS | Status: AC
  Filled 2020-04-17: qty 20

## 2020-04-17 MED ORDER — SUGAMMADEX SODIUM 200 MG/2ML IV SOLN
INTRAVENOUS | Status: DC | PRN
  Administered 2020-04-17: 200 mg via INTRAVENOUS

## 2020-04-17 MED ORDER — BUPIVACAINE HCL (PF) 0.25 % IJ SOLN
INTRAMUSCULAR | Status: DC | PRN
  Administered 2020-04-17: 20 mL

## 2020-04-17 MED ORDER — MIDAZOLAM HCL 5 MG/5ML IJ SOLN
INTRAMUSCULAR | Status: DC | PRN
  Administered 2020-04-17: 2 mg via INTRAVENOUS

## 2020-04-17 MED ORDER — ONDANSETRON HCL 4 MG/2ML IJ SOLN
INTRAMUSCULAR | Status: DC | PRN
  Administered 2020-04-17: 4 mg via INTRAVENOUS

## 2020-04-17 MED ORDER — LIDOCAINE 2% (20 MG/ML) 5 ML SYRINGE
INTRAMUSCULAR | Status: AC
  Filled 2020-04-17: qty 5

## 2020-04-17 SURGICAL SUPPLY — 37 items
APPLIER CLIP ROT 10 11.4 M/L (STAPLE)
BENZOIN TINCTURE PRP APPL 2/3 (GAUZE/BANDAGES/DRESSINGS) IMPLANT
CABLE HIGH FREQUENCY MONO STRZ (ELECTRODE) ×2 IMPLANT
CHLORAPREP W/TINT 26 (MISCELLANEOUS) ×2 IMPLANT
CLIP APPLIE ROT 10 11.4 M/L (STAPLE) IMPLANT
COVER SURGICAL LIGHT HANDLE (MISCELLANEOUS) ×2 IMPLANT
COVER WAND RF STERILE (DRAPES) IMPLANT
CUTTER FLEX LINEAR 45M (STAPLE) ×2 IMPLANT
DERMABOND ADVANCED (GAUZE/BANDAGES/DRESSINGS) ×1
DERMABOND ADVANCED .7 DNX12 (GAUZE/BANDAGES/DRESSINGS) ×1 IMPLANT
ELECT REM PT RETURN 15FT ADLT (MISCELLANEOUS) ×2 IMPLANT
ENDOLOOP SUT PDS II  0 18 (SUTURE)
ENDOLOOP SUT PDS II 0 18 (SUTURE) IMPLANT
GLOVE SURG SYN 7.5  E (GLOVE) ×1
GLOVE SURG SYN 7.5 E (GLOVE) ×1 IMPLANT
GOWN STRL REUS W/TWL XL LVL3 (GOWN DISPOSABLE) ×6 IMPLANT
KIT BASIN (CUSTOM PROCEDURE TRAY) ×2 IMPLANT
KIT TURNOVER KIT A (KITS) ×2 IMPLANT
PENCIL SMOKE EVACUATOR (MISCELLANEOUS) IMPLANT
POUCH RETRIEVAL ECOSAC 10 (ENDOMECHANICALS) ×1 IMPLANT
POUCH RETRIEVAL ECOSAC 10MM (ENDOMECHANICALS) ×1
POUCH SPECIMEN RETRIEVAL 10MM (ENDOMECHANICALS) IMPLANT
RELOAD 45 VASCULAR/THIN (ENDOMECHANICALS) IMPLANT
RELOAD STAPLE TA45 3.5 REG BLU (ENDOMECHANICALS) ×2 IMPLANT
SCISSORS LAP 5X35 DISP (ENDOMECHANICALS) IMPLANT
SET IRRIG TUBING LAPAROSCOPIC (IRRIGATION / IRRIGATOR) ×2 IMPLANT
SET TUBE SMOKE EVAC HIGH FLOW (TUBING) ×2 IMPLANT
SHEARS HARMONIC ACE PLUS 36CM (ENDOMECHANICALS) ×2 IMPLANT
SLEEVE XCEL OPT CAN 5 100 (ENDOMECHANICALS) ×2 IMPLANT
STRIP CLOSURE SKIN 1/2X4 (GAUZE/BANDAGES/DRESSINGS) IMPLANT
SUT MNCRL AB 4-0 PS2 18 (SUTURE) ×2 IMPLANT
SUT VIC AB 2-0 SH 18 (SUTURE) IMPLANT
TOWEL OR 17X26 10 PK STRL BLUE (TOWEL DISPOSABLE) ×2 IMPLANT
TOWEL OR NON WOVEN STRL DISP B (DISPOSABLE) ×2 IMPLANT
TRAY LAPAROSCOPIC (CUSTOM PROCEDURE TRAY) ×2 IMPLANT
TROCAR BLADELESS OPT 5 100 (ENDOMECHANICALS) ×2 IMPLANT
TROCAR XCEL BLUNT TIP 100MML (ENDOMECHANICALS) ×2 IMPLANT

## 2020-04-17 NOTE — Anesthesia Postprocedure Evaluation (Signed)
Anesthesia Post Note  Patient: Caleb Barrera  Procedure(s) Performed: APPENDECTOMY LAPAROSCOPIC (N/A Abdomen)     Patient location during evaluation: PACU Anesthesia Type: General Level of consciousness: awake Pain management: pain level controlled Vital Signs Assessment: post-procedure vital signs reviewed and stable Respiratory status: spontaneous breathing, nonlabored ventilation, respiratory function stable and patient connected to nasal cannula oxygen Cardiovascular status: blood pressure returned to baseline and stable Postop Assessment: no apparent nausea or vomiting Anesthetic complications: no    Last Vitals:  Vitals:   04/17/20 1300 04/17/20 1354  BP: (!) 184/60 (!) 182/66  Pulse: 64 66  Resp: 18 18  Temp: 36.7 C 36.5 C  SpO2: 97% 98%    Last Pain:  Vitals:   04/17/20 1354  TempSrc: Oral  PainSc:                  Jillien Yakel P Essa Malachi

## 2020-04-17 NOTE — Transfer of Care (Signed)
Immediate Anesthesia Transfer of Care Note  Patient: Caleb Barrera  Procedure(s) Performed: APPENDECTOMY LAPAROSCOPIC (N/A Abdomen)  Patient Location: PACU  Anesthesia Type:General  Level of Consciousness: drowsy and patient cooperative  Airway & Oxygen Therapy: Patient Spontanous Breathing and Patient connected to face mask oxygen  Post-op Assessment: Report given to RN and Post -op Vital signs reviewed and stable  Post vital signs: Reviewed and stable  Last Vitals:  Vitals Value Taken Time  BP 150/58 04/17/20 1008  Temp 36.8 C 04/17/20 1008  Pulse 62 04/17/20 1011  Resp 13 04/17/20 1011  SpO2 100 % 04/17/20 1011  Vitals shown include unvalidated device data.  Last Pain:  Vitals:   04/17/20 0534  TempSrc: Oral  PainSc:          Complications: No apparent anesthesia complications

## 2020-04-17 NOTE — Progress Notes (Addendum)
69yo Hispanic male with multiple medical problems including HTN, DM, HLD, CKD, CAD, and liver fibrosis.  He is visiting from [place].  He has been here 2 weeks and planned to stay for 4 weeks.  He is originally from [place].  Of note, his admission glucose was 420.  He has evidence of appendicitis on CT scan.  His wife, Verdis Frederickson, and nephew, Pamella Pert [phone]), are at the bedside.  The patient and his wife speak little [place].  His nephew is bilingual and we got the translation service, Anderson [place], to help.  I discussed with the patient the indications and risks of appendiceal surgery.  The primary risks of appendiceal surgery include, but are not limited to, bleeding, infection, bowel surgery, and open surgery.  There is also the risk that the patient may have continued symptoms after surgery.  We discussed the typical post-operative recovery course. I tried to answer the patient's questions.  The biggest risks probably involves exacerbating his underlying medical issues.  Alphonsa Overall, MD, [facility] Surgery Office phone:  [phone]

## 2020-04-17 NOTE — Anesthesia Procedure Notes (Signed)
Procedure Name: Intubation Date/Time: 04/17/2020 9:00 AM Performed by: Montel Clock, CRNA Pre-anesthesia Checklist: Patient identified, Emergency Drugs available, Suction available, Patient being monitored and Timeout performed Patient Re-evaluated:Patient Re-evaluated prior to induction Oxygen Delivery Method: Circle system utilized Preoxygenation: Pre-oxygenation with 100% oxygen Induction Type: IV induction and Rapid sequence Laryngoscope Size: Mac and 3 Grade View: Grade I Tube type: Oral Tube size: 7.5 mm Number of attempts: 1 Airway Equipment and Method: Stylet Placement Confirmation: ETT inserted through vocal cords under direct vision,  positive ETCO2 and breath sounds checked- equal and bilateral Secured at: 23 cm Tube secured with: Tape Dental Injury: Teeth and Oropharynx as per pre-operative assessment

## 2020-04-17 NOTE — Op Note (Signed)
Re:   Caleb Barrera DOB:   18-Apr-1951 MRN:   [id number]                   FACILITY:  [facility]  DATE OF PROCEDURE: 04/17/2020                              OPERATIVE REPORT  PREOPERATIVE DIAGNOSIS:  Appendicitis  POSTOPERATIVE DIAGNOSIS:  Acute suppurative appendicitis.  PROCEDURE:  Laparoscopic appendectomy.  SURGEON:  Fenton Malling. Lucia Gaskins, MD  ASSISTANT:  No first assistant.  ANESTHESIA:  General endotracheal.  Anesthesiologist: Murvin Natal, MD CRNA: Montel Clock, CRNA  ASA:  3 Emergent  ESTIMATED BLOOD LOSS:  Minimal.  DRAINS: none   SPECIMEN:   Appendix  COUNTS CORRECT:  YES  INDICATIONS FOR PROCEDURE: Caleb Barrera is a 69 y.o. (DOB: 1951/01/25) Hispanic male whose primary care doctor is Patient, No Pcp Per and comes to the OR for an appendectomy.   He is from [place] and is visiting family in the [place] area.  He has significant medical issues.  I discussed with the patient, the indications and potential complications of appendiceal surgery.  The potential complications include, but are not limited to, bleeding, open surgery, bowel resection, and the possibility of another diagnosis.  OPERATIVE NOTE:  The patient underwent a general endotracheal anesthetic as supervised by Anesthesiologist: Murvin Natal, MD CRNA: Montel Clock, CRNA, General, in [place] room #1.  The patient was on Zosyn prior to the beginning of the procedure and the abdomen was prepped with ChloraPrep.   A time-out was held and surgical checklist run.  An infraumbilical incision was made with sharp dissection carried down to the abdominal cavity.  An 12 mm Hasson trocar was inserted through the infraumbilical incision and into the peritoneal cavity.  A 30 degree 5 mm laparoscope was inserted through a 12 mm Hasson trocar and the Hasson trocar secured with a 0 Vicryl suture.  I placed a 5 mm trocar in the right upper quadrant and a 5 mm torcar in left lower quadrant and did  abdominal exploration.    The right and left lobes of liver unremarkable.  Stomach was unremarkable.  The gall bladder showed no evidence of acute inflammation.  The pelvic organs were unremarkable.  I saw no other intra-abdominal abnormality.  The patient had appendicitis with the appendix located right pelvic brim.  There was purulence on the appendix, but the appendix was not ruptured.  The mesentery of the appendix was divided with a Harmonic scalpel.  I got to the base of the appendix.  I then used a blue load 45 mm Ethicon Endo-GIA stapler and fired this across the base of the appendix.  I placed the appendix in Eco Sac bag and delivered the bag through the umbilical incision.  I irrigated the abdomen with 500 cc of saline.  After irrigating the abdomen, I then removed the trocars, in turn.  The umbilical port fascia was closed with 0 Vicryl suture.   I closed the skin each site with a 4-0 Monocryl suture and painted the wounds with DermaBond.  I then injected a total of 30 mL of 0.25% Marcaine at the incisions.  Sponge and needle count were correct at the end of the case.  The patient was transferred to the recovery room in good condition.  The patient tolerated the procedure well and it depends on the patient's post op  clinical course as to when the patient could be discharged.   Alphonsa Overall, MD, [facility] Surgery Pager: [phone] Office phone:  [phone]

## 2020-04-17 NOTE — Anesthesia Preprocedure Evaluation (Addendum)
Anesthesia Evaluation  Patient identified by MRN, date of birth, ID band Patient awake    Reviewed: Allergy & Precautions, NPO status , Patient's Chart, lab work & pertinent test results  Airway Mallampati: III  TM Distance: >3 FB Neck ROM: Full    Dental no notable dental hx.    Pulmonary former smoker,    Pulmonary exam normal breath sounds clear to auscultation       Cardiovascular hypertension, Pt. on home beta blockers + CAD and + CABG  Normal cardiovascular exam Rhythm:Regular Rate:Normal  ECHO: 1. Left ventricular ejection fraction, by estimation, is 65 to 70%. The left ventricle has normal function. The left ventricle has no regional wall motion abnormalities. Left ventricular diastolic parameters are indeterminate. Elevated left ventricular end-diastolic pressure. 2. Right ventricular systolic function is normal. The right ventricular size is normal. Tricuspid regurgitation signal is inadequate for assessing PA pressure. 3. The mitral valve is normal in structure. Mild mitral valve regurgitation. No evidence of mitral stenosis. 4. The aortic valve is tricuspid. Aortic valve regurgitation is mild. Mild aortic valve sclerosis is present, with no evidence of aortic valve stenosis. 5. The inferior vena cava is normal in size with greater than 50% respiratory variability, suggesting right atrial pressure of 3 mmHg.   Neuro/Psych CVA, No Residual Symptoms negative psych ROS   GI/Hepatic Neg liver ROS, GERD  Medicated and Controlled,  Endo/Other  diabetes, Insulin DependentHypothyroidism   Renal/GU CRFRenal disease     Musculoskeletal negative musculoskeletal ROS (+)   Abdominal   Peds  Hematology  (+) anemia , HLD   Anesthesia Other Findings appendicitis  Reproductive/Obstetrics                            Anesthesia Physical Anesthesia Plan  ASA: III  Anesthesia Plan: General    Post-op Pain Management:    Induction: Intravenous  PONV Risk Score and Plan: 2 and Ondansetron, Dexamethasone, Midazolam and Treatment may vary due to age or medical condition  Airway Management Planned: Oral ETT  Additional Equipment:   Intra-op Plan:   Post-operative Plan: Extubation in OR  Informed Consent: I have reviewed the patients History and Physical, chart, labs and discussed the procedure including the risks, benefits and alternatives for the proposed anesthesia with the patient or authorized representative who has indicated his/her understanding and acceptance.     Dental advisory given  Plan Discussed with: CRNA  Anesthesia Plan Comments: (Video interpretation service utilized to discuss the anesthetic plan.)       Anesthesia Quick Evaluation

## 2020-04-17 NOTE — Progress Notes (Signed)
[facility]  PROGRESS NOTE  Caleb Barrera QPR:[id number] DOB: 1951-05-25 DOA: 04/15/2020 PCP: Patient, No Pcp Per   Brief HPI:   69 year old male with history of diabetes mellitus on insulin, CKD stage IV, ischemic cardiomyopathy with RCA disease, chronic diastolic CHF, hypertension, hyperlipidemia, hypothyroidism, obesity, metabolic syndrome, liver fibrosis was brought to ED for hyperglycemia shaking confused.  Patient was found to have elevated blood glucose.  Was also complaining of diffuse abdominal pain after eating beans.  CT abdomen pelvis showed acute appendicitis with tiny appendicolith, no abscess.  General surgery was consulted    Subjective   Patient seen and examined, s/p laparoscopic  Appendectomy.  Started on clear liquid diet.  Blood pressure is elevated.   Assessment/Plan:     1. Sepsis with acute appendicitis-patient presented with fever 101, leukocytosis of 18,000.  CT scan showed acute appendicitis, general surgery was consulted.  Patient was on IV antibiotics with IV Zosyn.  WBC has improved to 13,000.  He underwent laparoscopic appendectomy today.  Started on clear liquid diet.  We will continue with IV Zosyn. 2. CAD/ischemic cardiomyopathy/artery disease-patient had PCI on proximal and ostial RCA in 06/30/2013 he was discharged on Plavix 12 months.  Cardiology was consulted.  Patient was deemed high risk for surgery.  Echocardiogram was recommended.  Echocardiogram showed EF 65 to 70%.  Indeterminate diastolic parameters.  Elevated left ventricular end-diastolic pressure.  Recommended to continue with statin, Coreg. 3. Chronic diastolic CHF-euvolemic, no exacerbation.  Lasix was held yesterday, will resume Lasix in a.m. 4. Hypertension-blood pressure is elevated, patient was started on Coreg yesterday.  Olmesartan was held.  Will restart nifedipine XL 30 mg daily. 5. Diabetes mellitus type 2-continue sliding scale insulin with NovoLog. 6. Acute kidney  injury on CKD stage IV-last creatinine from 2009 showed creatinine of 2.42.8.  And 3.08 in September 2020.  Today creatinine has improved to 2.46.  Follow BMP in a.m. 7. Hypothyroidism-continue Synthroid 8. Enlarged prostate-continue Flomax, Myrbetriq 9. Thrombocytopenia-mild likely from liver fibrosis.    SpO2: 97 % O2 Flow Rate (L/min): 2 L/min   COVID-19 Labs  No results for input(s): DDIMER, FERRITIN, LDH, CRP in the last 72 hours.  Lab Results  Component Value Date   [place] NEGATIVE 04/15/2020     CBG: Recent Labs  Lab 04/17/20 0105 04/17/20 0535 04/17/20 1014 04/17/20 1050 04/17/20 1058  GLUCAP 198* 98 75 68* 81    CBC: Recent Labs  Lab 04/15/20 2338 04/16/20 1151 04/17/20 0427  WBC 18.8* 17.9* 13.6*  NEUTROABS 16.4*  --   --   HGB 9.7* 9.9* 9.8*  HCT 28.7* 30.3* 30.7*  MCV 88.3 89.9 90.3  PLT 120* 119* 129*    Basic Metabolic Panel: Recent Labs  Lab 04/15/20 2338 04/16/20 1151 04/17/20 0427  NA 131*  --  140  K 4.5  --  4.2  CL 97*  --  108  CO2 25  --  26  GLUCOSE 420*  --  121*  BUN 75*  --  46*  CREATININE 3.12* 2.73* 2.46*  CALCIUM 8.2*  --  8.3*     Liver Function Tests: Recent Labs  Lab 04/15/20 2338 04/17/20 0427  AST 14* 13*  ALT 25 20  ALKPHOS 82 46  BILITOT 0.4 0.3  PROT 6.1* 5.6*  ALBUMIN 3.3* 2.8*        DVT prophylaxis: SCDs  Code Status: Full code  Family Communication: No family at bedside  Disposition Plan:   Status is: Inpatient  Dispo:  The patient is from: Home              Anticipated d/c is to: Home              Anticipated d/c date is: 04/19/2020              Patient currently admitted with acute appendicitis.  S/p appendectomy.  Barrier to discharge-continue postop care, will discharge home when okay with surgery.        Scheduled medications:  . carvedilol  12.5 mg Oral BID WC  . insulin aspart  0-6 Units Subcutaneous Q6H  . levothyroxine  100 mcg Oral QAC breakfast  .  metoCLOPramide  10 mg Oral BID  . mirabegron ER  50 mg Oral Daily  . NIFEdipine  30 mg Oral Daily  . pantoprazole  40 mg Oral Daily  . pregabalin  50 mg Oral BID  . rosuvastatin  20 mg Oral Daily  . tamsulosin  0.8 mg Oral Daily    Consultants:  General surgery  Procedures:  Laparoscopic appendectomy  Antibiotics:   Anti-infectives (From admission, onward)   Start     Dose/Rate Route Frequency Ordered Stop   04/16/20 1000  piperacillin-tazobactam (ZOSYN) IVPB 3.375 g     3.375 g 12.5 mL/hr over 240 Minutes Intravenous Every 8 hours 04/16/20 0846     04/16/20 0845  piperacillin-tazobactam (ZOSYN) IVPB 3.375 g  Status:  Discontinued     3.375 g 100 mL/hr over 30 Minutes Intravenous Every 8 hours 04/16/20 0842 04/16/20 0846   04/16/20 0430  piperacillin-tazobactam (ZOSYN) IVPB 3.375 g     3.375 g 100 mL/hr over 30 Minutes Intravenous  Once 04/16/20 0419 04/16/20 0539   04/16/20 0400  piperacillin-tazobactam (ZOSYN) IVPB 2.25 g  Status:  Discontinued     2.25 g 100 mL/hr over 30 Minutes Intravenous Every 6 hours 04/16/20 0356 04/16/20 0429       Objective   Vitals:   04/17/20 1045 04/17/20 1100 04/17/20 1126 04/17/20 1300  BP: (!) 149/55 (!) 167/58 (!) 167/52 (!) 184/60  Pulse: 66 64 63 64  Resp: 15 12 16 18   Temp:   98 F (36.7 C) 98 F (36.7 C)  TempSrc:   Oral Oral  SpO2: 100% 100% 97% 97%  Weight:        Intake/Output Summary (Last 24 hours) at 04/17/2020 1348 Last data filed at 04/17/2020 1030 Gross per 24 hour  Intake 1107.78 ml  Output 10 ml  Net 1097.78 ml    05/13 1901 - 05/15 0700 In: 1317.7 [I.V.:163.1] Out: 1000 [Urine:1000]  Filed Weights   04/16/20 0352  Weight: 65.3 kg    Physical Examination:   General-appears in no acute distress Heart-S1-S2, regular, no murmur auscultated Lungs-clear to auscultation bilaterally, no wheezing or crackles auscultated Abdomen-soft, nontender, no organomegaly Extremities-no edema in the lower  extremities Neuro-alert, oriented x3, no focal deficit noted   Data Reviewed:   Recent Results (from the past 240 hour(s))  SARS Coronavirus 2 by RT PCR (hospital order, performed in [facility] hospital lab) Nasopharyngeal Nasopharyngeal Swab     Status: None   Collection Time: 04/15/20 11:38 PM   Specimen: Nasopharyngeal Swab  Result Value Ref Range Status   SARS Coronavirus 2 NEGATIVE NEGATIVE Final    Comment: (NOTE) SARS-CoV-2 target nucleic acids are NOT DETECTED. The SARS-CoV-2 RNA is generally detectable in upper and lower respiratory specimens during the acute phase of infection. The lowest concentration of SARS-CoV-2 viral copies this  assay can detect is 250 copies / mL. A negative result does not preclude SARS-CoV-2 infection and should not be used as the sole basis for treatment or other patient management decisions.  A negative result may occur with improper specimen collection / handling, submission of specimen other than nasopharyngeal swab, presence of viral mutation(s) within the areas targeted by this assay, and inadequate number of viral copies (<250 copies / mL). A negative result must be combined with clinical observations, patient history, and epidemiological information. Fact Sheet for Patients:   StrictlyIdeas.no Fact Sheet for Healthcare Providers: BankingDealers.co.za This test is not yet approved or cleared  by the [place] FDA and has been authorized for detection and/or diagnosis of SARS-CoV-2 by FDA under an Emergency Use Authorization (EUA).  This EUA will remain in effect (meaning this test can be used) for the duration of the COVID-19 declaration under Section 564(b)(1) of the Act, 21 U.S.C. section 360bbb-3(b)(1), unless the authorization is terminated or revoked sooner. Performed at [facility], [place] [address] [place], [place] [address]   Culture, blood (routine x 2)      Status: None (Preliminary result)   Collection Time: 04/16/20  5:01 AM   Specimen: BLOOD RIGHT HAND  Result Value Ref Range Status   Specimen Description BLOOD RIGHT HAND  Final   Special Requests   Final    BOTTLES DRAWN AEROBIC AND ANAEROBIC Blood Culture adequate volume Performed at [facility] [address], [place], [place] [address]    Culture   Final    NO GROWTH 1 DAY Performed at [facility], [place] [address] [place], [place] [address]    Report Status PENDING  Incomplete  Culture, blood (routine x 2)     Status: None (Preliminary result)   Collection Time: 04/16/20  5:03 AM   Specimen: BLOOD  Result Value Ref Range Status   Specimen Description   Final    BLOOD RIGHT ANTECUBITAL Performed at [facility] [address] [place], [place] [address]    Special Requests   Final    BOTTLES DRAWN AEROBIC AND ANAEROBIC Blood Culture adequate volume Performed at [facility] [address] [place], [place] [address]    Culture   Final    NO GROWTH 1 DAY Performed at [facility], [place] [address] [place], [place] [address]    Report Status PENDING  Incomplete  Surgical pcr screen     Status: None   Collection Time: 04/16/20  7:46 PM   Specimen: Nasal Mucosa; Nasal Swab  Result Value Ref Range Status   MRSA, PCR NEGATIVE NEGATIVE Final   Staphylococcus aureus NEGATIVE NEGATIVE Final    Comment: (NOTE) The Xpert SA Assay (FDA approved for NASAL specimens in patients 49 years of age and older), is one component of a comprehensive surveillance program. It is not intended to diagnose infection nor to guide or monitor treatment. Performed at [facility], [address] [place],  [address]     Recent Labs  Lab 04/15/20 2338  LIPASE 24   No results for input(s): AMMONIA in the last 168 hours.  Cardiac Enzymes: No results for input(s): CKTOTAL, CKMB, CKMBINDEX,  TROPONINI in the last 168 hours. BNP (last 3 results) No results for input(s): BNP in the last 8760 hours.  ProBNP (last 3 results) No results for input(s): PROBNP in the last 8760 hours.  Studies:  CT Abdomen Pelvis Wo Contrast  Result Date: 04/16/2020 CLINICAL DATA:  Acute nonlocalized abdominal pain with fever. EXAM: CT ABDOMEN AND PELVIS WITHOUT CONTRAST TECHNIQUE: Multidetector CT imaging of the abdomen and pelvis was performed following the standard protocol without IV contrast. COMPARISON:  None. FINDINGS: Lower chest: Moderate hiatal hernia containing stomach, fat, and trace ascitic fluid. Extensive right coronary atherosclerotic calcification. Hepatobiliary: No focal liver abnormality.Cholelithiasis without inflammation or ductal dilatation by CT. Pancreas: Unremarkable. Spleen: Unremarkable. Adrenals/Urinary Tract: Negative adrenals. No hydronephrosis or stone. Symmetric renal atrophy. Unremarkable bladder. Stomach/Bowel: Thickened appendix with tiny appendicolith on reformats and mesoappendiceal fat stranding. Outer wall diameter is 12 mm on coronal reformats. The appendix extends inferiorly from the cecum, which is over the mid right flank. Mild for age colonic diverticulosis.  No bowel obstruction. Vascular/Lymphatic: Diffuse atherosclerotic calcification. No mass or adenopathy. Reproductive:Symmetric enlargement of the prostate. Other: No ascites or pneumoperitoneum. Musculoskeletal: Spondylosis and facet arthropathy. IMPRESSION: 1. Acute appendicitis with tiny appendicolith. No abscess/visible perforation. 2. Cholelithiasis. 3.  Aortic Atherosclerosis (ICD10-I70.0). Electronically Signed   By: Monte Fantasia M.D.   On: 04/16/2020 05:23   ECHOCARDIOGRAM COMPLETE  Result Date: 04/16/2020    ECHOCARDIOGRAM REPORT   Patient Name:   Caleb Barrera Date of Exam: 04/16/2020 Medical Rec #:  [id number]                  Height: Accession #:    [id number]                 Weight:        144.0 lb Date of Birth:  23-Mar-1951                   BSA:          1.622 m Patient Age:    43 years                   BP:           99/82 mmHg Patient Gender: M                          HR:           68 bpm. Exam Location:  Inpatient Procedure: 2D Echo Indications:    CAD Native Vessel 414.01 / I25.10  History:        Patient has no prior history of Echocardiogram examinations.                 Risk Factors:Diabetes. CKD                 Sepsis.  Sonographer:    Vikki Ports Turrentine Referring Phys: [id number] [place]  1. Left ventricular ejection fraction, by estimation, is 65 to 70%. The left ventricle has normal function. The left ventricle has no regional wall motion abnormalities. Left ventricular diastolic parameters are indeterminate. Elevated left ventricular end-diastolic pressure.  2. Right ventricular systolic function is normal. The right ventricular size is normal. Tricuspid regurgitation signal is inadequate for assessing PA pressure.  3. The mitral valve is normal in structure. Mild mitral valve regurgitation. No evidence of mitral stenosis.  4. The aortic valve is tricuspid. Aortic valve regurgitation is mild. Mild aortic valve sclerosis is present, with no evidence of aortic valve stenosis.  5. The inferior vena cava is normal in size with greater than 50% respiratory variability, suggesting right atrial pressure of 3 mmHg. FINDINGS  Left Ventricle: Left ventricular ejection fraction, by  estimation, is 65 to 70%. The left ventricle has normal function. The left ventricle has no regional wall motion abnormalities. The left ventricular internal cavity size was normal in size. There is  no left ventricular hypertrophy. Left ventricular diastolic parameters are indeterminate. Elevated left ventricular end-diastolic pressure. Right Ventricle: The right ventricular size is normal. No increase in right ventricular wall thickness. Right ventricular systolic function is normal. Tricuspid  regurgitation signal is inadequate for assessing PA pressure. Left Atrium: Left atrial size was normal in size. Right Atrium: Right atrial size was normal in size. Pericardium: There is no evidence of pericardial effusion. Mitral Valve: The mitral valve is normal in structure. Normal mobility of the mitral valve leaflets. Mild mitral valve regurgitation. No evidence of mitral valve stenosis. Tricuspid Valve: The tricuspid valve is normal in structure. Tricuspid valve regurgitation is not demonstrated. No evidence of tricuspid stenosis. Aortic Valve: The aortic valve is tricuspid. Aortic valve regurgitation is mild. Aortic regurgitation PHT measures 567 msec. Mild aortic valve sclerosis is present, with no evidence of aortic valve stenosis. Pulmonic Valve: The pulmonic valve was normal in structure. Pulmonic valve regurgitation is not visualized. No evidence of pulmonic stenosis. Aorta: The aortic root is normal in size and structure. Venous: The inferior vena cava is normal in size with greater than 50% respiratory variability, suggesting right atrial pressure of 3 mmHg. IAS/Shunts: No atrial level shunt detected by color flow Doppler.  LEFT VENTRICLE PLAX 2D LVIDd:         4.30 cm  Diastology LVIDs:         2.70 cm  LV e' lateral:   8.93 cm/s LV PW:         1.00 cm  LV E/e' lateral: 11.4 LV IVS:        1.00 cm  LV e' medial:    6.06 cm/s LVOT diam:     1.70 cm  LV E/e' medial:  16.8 LV SV:         56 LV SV Index:   35 LVOT Area:     2.27 cm  RIGHT VENTRICLE RV S prime:     9.31 cm/s TAPSE (M-mode): 1.5 cm LEFT ATRIUM             Index       RIGHT ATRIUM           Index LA diam:        4.70 cm 2.90 cm/m  RA Area:     11.10 cm LA Vol (A2C):   46.0 ml 28.36 ml/m RA Volume:   23.00 ml  14.18 ml/m LA Vol (A4C):   42.2 ml 26.02 ml/m LA Biplane Vol: 44.2 ml 27.25 ml/m  AORTIC VALVE LVOT Vmax:   112.00 cm/s LVOT Vmean:  74.100 cm/s LVOT VTI:    0.247 m AI PHT:      567 msec  AORTA Ao Root diam: 3.10 cm MITRAL VALVE  MV Area (PHT): 3.31 cm     SHUNTS MV Decel Time: 229 msec     Systemic VTI:  0.25 m MV E velocity: 102.00 cm/s  Systemic Diam: 1.70 cm MV A velocity: 91.70 cm/s MV E/A ratio:  1.11 Fransico Him MD Electronically signed by Fransico Him MD Signature Date/Time: 04/16/2020/2:51:08 PM    Final        Oswald Hillock   [facility] If 7PM-7AM, please contact night-coverage at [email or web address], Office  [phone]   04/17/2020, 1:48 PM  LOS: 1 day

## 2020-04-17 NOTE — Progress Notes (Signed)
Family at bedside assisting with interpretation.

## 2020-04-18 DIAGNOSIS — I5032 Chronic diastolic (congestive) heart failure: Secondary | ICD-10-CM

## 2020-04-18 DIAGNOSIS — I2510 Atherosclerotic heart disease of native coronary artery without angina pectoris: Secondary | ICD-10-CM

## 2020-04-18 LAB — BASIC METABOLIC PANEL
Anion gap: 7 (ref 5–15)
BUN: 39 mg/dL — ABNORMAL HIGH (ref 8–23)
CO2: 26 mmol/L (ref 22–32)
Calcium: 8.2 mg/dL — ABNORMAL LOW (ref 8.9–10.3)
Chloride: 103 mmol/L (ref 98–111)
Creatinine, Ser: 2.64 mg/dL — ABNORMAL HIGH (ref 0.61–1.24)
GFR calc Af Amer: 27 mL/min — ABNORMAL LOW (ref >60)
GFR calc non Af Amer: 23 mL/min — ABNORMAL LOW (ref >60)
Glucose, Bld: 234 mg/dL — ABNORMAL HIGH (ref 70–99)
Potassium: 4.3 mmol/L (ref 3.5–5.1)
Sodium: 136 mmol/L (ref 135–145)

## 2020-04-18 LAB — GLUCOSE, CAPILLARY
Glucose-Capillary: 200 mg/dL — ABNORMAL HIGH (ref 70–99)
Glucose-Capillary: 316 mg/dL — ABNORMAL HIGH (ref 70–99)

## 2020-04-18 MED ORDER — ASPIRIN EC 81 MG PO TBEC
81.0000 mg | DELAYED_RELEASE_TABLET | Freq: Every day | ORAL | Status: DC
  Administered 2020-04-18: 81 mg via ORAL
  Filled 2020-04-18: qty 1

## 2020-04-18 MED ORDER — CARVEDILOL 25 MG PO TABS: 25.0000 mg | ORAL_TABLET | Freq: Two times a day (BID) | ORAL | Status: DC

## 2020-04-18 MED ORDER — FUROSEMIDE 20 MG PO TABS: 20.0000 mg | ORAL_TABLET | Freq: Every day | ORAL | 1 refills | Status: AC

## 2020-04-18 MED ORDER — CARVEDILOL 12.5 MG PO TABS
12.5000 mg | ORAL_TABLET | Freq: Once | ORAL | Status: AC
  Administered 2020-04-18: 12.5 mg via ORAL
  Filled 2020-04-18: qty 1

## 2020-04-18 MED ORDER — HYDROCODONE-ACETAMINOPHEN 5-325 MG PO TABS: 1.0000 | ORAL_TABLET | Freq: Four times a day (QID) | ORAL | 0 refills | Status: AC | PRN

## 2020-04-18 MED ORDER — FAMOTIDINE 20 MG PO TABS
20.0000 mg | ORAL_TABLET | Freq: Every day | ORAL | Status: DC
  Administered 2020-04-18: 20 mg via ORAL
  Filled 2020-04-18: qty 1

## 2020-04-18 MED ORDER — ASPIRIN 81 MG PO TBEC: 81.0000 mg | DELAYED_RELEASE_TABLET | Freq: Every day | ORAL | 2 refills | Status: AC

## 2020-04-18 NOTE — Progress Notes (Signed)
The patient is receiving Pepcid by the intravenous route.  Based on criteria approved by the Pharmacy and [place], the medication is being converted to the equivalent oral dose form.  These criteria include: -No active GI bleeding -Able to tolerate diet of full liquids (or better) or tube feeding -Able to tolerate other medications by the oral or enteral route  If you have any questions about this conversion, please contact the Pharmacy Department (phone [phone]).  Thank you.  Minda Ditto PharmD Pager [phone] 04/18/2020, 8:35 AM

## 2020-04-18 NOTE — Discharge Summary (Addendum)
Physician Discharge Summary  Caleb Barrera HAL:[id number] DOB: June 27, 1951 DOA: 04/15/2020  PCP: Patient, No Pcp Per  Admit date: 04/15/2020 Discharge date: 04/18/2020  Time spent: 50 minutes  Recommendations for Outpatient Follow-up:  1. Start taking aspirin 81 mg p.o. daily 2. Change Lasix to 20 mg p.o. daily, take additional dose of Lasix for more than 3 pounds weight gain. 3. Start taking olmesartan  Discharge Diagnoses:  Principal Problem:   Sepsis ([place]) Active Problems:   Poorly controlled diabetes mellitus (HCC)   CKD (chronic kidney disease) stage 4, GFR 15-29 ml/min (HCC)   BPH (benign prostatic hyperplasia)   GERD (gastroesophageal reflux disease)   Insulin-requiring or dependent type II diabetes mellitus ([place])   Acute appendicitis   Discharge Condition: Stable  Diet recommendation: Heart healthy diet  Filed Weights   04/16/20 0352  Weight: 65.3 kg    History of present illness:  69 year old male with history of diabetes mellitus on insulin, CKD stage IV, ischemic cardiomyopathy with RCA disease, chronic diastolic CHF, hypertension, hyperlipidemia, hypothyroidism, obesity, metabolic syndrome, liver fibrosis was brought to ED for hyperglycemia shaking confused.  Patient was found to have elevated blood glucose.  Was also complaining of diffuse abdominal pain after eating beans.  CT abdomen pelvis showed acute appendicitis with tiny appendicolith, no abscess.  General surgery was consulted   Hospital Course:   1. Sepsis with acute appendicitis-resolved, patient presented with fever 101, leukocytosis of 18,000.  CT scan showed acute appendicitis, general surgery was consulted.  Patient was on IV antibiotics with IV Zosyn.  WBC has improved to 13,000.  He underwent laparoscopic appendectomy .    Tolerating diet well.  General surgery has recommended that patient can be discharged today.  No IV antibiotics needed.  Patient will be prescribed as needed Vicodin as  per surgery.  No follow-up is needed unless patient has any complication then he can follow-up with general surgery as outpatient. 2. CAD/ischemic cardiomyopathy/artery disease-patient had PCI on proximal and ostial RCA in 06/30/2013 he was discharged on Plavix 12 months.  Cardiology was consulted.  Patient was deemed high risk for surgery.  Echocardiogram was recommended.  Echocardiogram showed EF 65 to 70%.  Indeterminate diastolic parameters.  Elevated left ventricular end-diastolic pressure.  Recommended to continue with statin, Coreg. 3. Chronic diastolic CHF-euvolemic, no exacerbation.  Lasix was held yesterday, discussed with cardiologist on-call Dr. Oswaldo Milian, he recommends a good on the dose of Lasix 20 mg daily.  Take additional dose for more than 3 pounds weight gain. 4. Hypertension-blood pressure is elevated, patient was started on Coreg yesterday.  Restarted on nifedipine XL 30 mg p.o. daily olmesartan has  been discontinued due to worsening renal function. 5. Diabetes mellitus type 2-continue insulin with home regimen. 6. Acute kidney injury on CKD stage IV-last creatinine from 2009 showed creatinine of 2.42.8.  And 3.08 in September 2020.  Today creatinine is stable at 2.64.  Discussed with cardiology, dose of Lasix has been cut down to 20 mg daily, olmesartan has been discontinued.  Patient to follow-up with cardiology/nephrology in [place]. 7. Hypothyroidism-continue Synthroid 8. Enlarged prostate-continue Flomax, Myrbetriq 9. Thrombocytopenia-mild likely from liver fibrosis.  Procedures:  Laparoscopic appendectomy  Consultations:  General surgery  Cardiology  Discharge Exam: Vitals:   04/18/20 0229 04/18/20 0609  BP: (!) 140/54 (!) 168/55  Pulse: 68 65  Resp: 17 18  Temp: 98.6 F (37 C) 97.9 F (36.6 C)  SpO2: 96% 97%    General: Appears in no acute distress Cardiovascular:  S1-S2, regular Respiratory: Clear to auscultation bilaterally  Discharge  Instructions   Discharge Instructions    Diet - low sodium heart healthy   Complete by: As directed    Discharge instructions   Complete by: As directed    Weigh yourself daily. Take additional dose of lasix for weight more than 3 pounds.   Increase activity slowly   Complete by: As directed      Allergies as of 04/18/2020   No Known Allergies     Medication List    STOP taking these medications   olmesartan 20 MG tablet Commonly known as: BENICAR     TAKE these medications   aspirin 81 MG EC tablet Take 1 tablet (81 mg total) by mouth daily.   carvedilol 12.5 MG tablet Commonly known as: COREG Take 12.5 mg by mouth 2 (two) times daily with a meal.   furosemide 20 MG tablet Commonly known as: Lasix Take 1 tablet (20 mg total) by mouth daily. Weigh yourself daily. Take additional dose for weight gain of more than 3 lbs. What changed:   medication strength  how much to take  additional instructions   HYDROcodone-acetaminophen 5-325 MG tablet Commonly known as: NORCO/VICODIN Take 1-2 tablets by mouth every 6 (six) hours as needed for moderate pain or severe pain.   levothyroxine 100 MCG tablet Commonly known as: SYNTHROID Take 100 mcg by mouth daily before breakfast.   metoCLOPramide 10 MG tablet Commonly known as: REGLAN Take 10 mg by mouth in the morning and at bedtime.   Myrbetriq 50 MG Tb24 tablet Generic drug: mirabegron ER Take 50 mg by mouth daily.   NIFEdipine 30 MG 24 hr tablet Commonly known as: PROCARDIA-XL/NIFEDICAL-XL Take 30 mg by mouth daily.   pantoprazole 40 MG tablet Commonly known as: PROTONIX Take 40 mg by mouth daily.   pregabalin 50 MG capsule Commonly known as: LYRICA Take 50 mg by mouth 2 (two) times daily.   rosuvastatin 20 MG tablet Commonly known as: CRESTOR Take 20 mg by mouth daily.   tamsulosin 0.4 MG Caps capsule Commonly known as: FLOMAX Take 0.8 mg by mouth daily.   Tyler Aas FlexTouch 200 UNIT/ML FlexTouch  Pen Generic drug: insulin degludec Inject 20 Units into the skin.      No Known Allergies Follow-up Information    Surgery, [place] Follow up in 2 week(s).   Specialty: General Surgery Why: If already gone to [facility], call office with any issues.  Especially fevers/chills/recurrent abdominal pain.   Contact information: [place] [address] [phone]            The results of significant diagnostics from this hospitalization (including imaging, microbiology, ancillary and laboratory) are listed below for reference.    Significant Diagnostic Studies: CT Abdomen Pelvis Wo Contrast  Result Date: 04/16/2020 CLINICAL DATA:  Acute nonlocalized abdominal pain with fever. EXAM: CT ABDOMEN AND PELVIS WITHOUT CONTRAST TECHNIQUE: Multidetector CT imaging of the abdomen and pelvis was performed following the standard protocol without IV contrast. COMPARISON:  None. FINDINGS: Lower chest: Moderate hiatal hernia containing stomach, fat, and trace ascitic fluid. Extensive right coronary atherosclerotic calcification. Hepatobiliary: No focal liver abnormality.Cholelithiasis without inflammation or ductal dilatation by CT. Pancreas: Unremarkable. Spleen: Unremarkable. Adrenals/Urinary Tract: Negative adrenals. No hydronephrosis or stone. Symmetric renal atrophy. Unremarkable bladder. Stomach/Bowel: Thickened appendix with tiny appendicolith on reformats and mesoappendiceal fat stranding. Outer wall diameter is 12 mm on coronal reformats. The appendix extends inferiorly from the cecum, which  is over the mid right flank. Mild for age colonic diverticulosis.  No bowel obstruction. Vascular/Lymphatic: Diffuse atherosclerotic calcification. No mass or adenopathy. Reproductive:Symmetric enlargement of the prostate. Other: No ascites or pneumoperitoneum. Musculoskeletal: Spondylosis and facet arthropathy. IMPRESSION: 1. Acute appendicitis with tiny appendicolith. No  abscess/visible perforation. 2. Cholelithiasis. 3.  Aortic Atherosclerosis (ICD10-I70.0). Electronically Signed   By: Monte Fantasia M.D.   On: 04/16/2020 05:23   ECHOCARDIOGRAM COMPLETE  Result Date: 04/16/2020    ECHOCARDIOGRAM REPORT   Patient Name:   Caleb Barrera Date of Exam: 04/16/2020 Medical Rec #:  [id number]                  Height: Accession #:    [id number]                 Weight:       144.0 lb Date of Birth:  1951-05-26                   BSA:          1.622 m Patient Age:    69 years                   BP:           99/82 mmHg Patient Gender: M                          HR:           68 bpm. Exam Location:  Inpatient Procedure: 2D Echo Indications:    CAD Native Vessel 414.01 / I25.10  History:        Patient has no prior history of Echocardiogram examinations.                 Risk Factors:Diabetes. CKD                 Sepsis.  Sonographer:    Vikki Ports Turrentine Referring Phys: [id number] [place]  1. Left ventricular ejection fraction, by estimation, is 65 to 70%. The left ventricle has normal function. The left ventricle has no regional wall motion abnormalities. Left ventricular diastolic parameters are indeterminate. Elevated left ventricular end-diastolic pressure.  2. Right ventricular systolic function is normal. The right ventricular size is normal. Tricuspid regurgitation signal is inadequate for assessing PA pressure.  3. The mitral valve is normal in structure. Mild mitral valve regurgitation. No evidence of mitral stenosis.  4. The aortic valve is tricuspid. Aortic valve regurgitation is mild. Mild aortic valve sclerosis is present, with no evidence of aortic valve stenosis.  5. The inferior vena cava is normal in size with greater than 50% respiratory variability, suggesting right atrial pressure of 3 mmHg. FINDINGS  Left Ventricle: Left ventricular ejection fraction, by estimation, is 65 to 70%. The left ventricle has normal function. The left ventricle has  no regional wall motion abnormalities. The left ventricular internal cavity size was normal in size. There is  no left ventricular hypertrophy. Left ventricular diastolic parameters are indeterminate. Elevated left ventricular end-diastolic pressure. Right Ventricle: The right ventricular size is normal. No increase in right ventricular wall thickness. Right ventricular systolic function is normal. Tricuspid regurgitation signal is inadequate for assessing PA pressure. Left Atrium: Left atrial size was normal in size. Right Atrium: Right atrial size was normal in size. Pericardium: There is no evidence of pericardial effusion. Mitral Valve: The mitral valve is normal in structure. Normal mobility  of the mitral valve leaflets. Mild mitral valve regurgitation. No evidence of mitral valve stenosis. Tricuspid Valve: The tricuspid valve is normal in structure. Tricuspid valve regurgitation is not demonstrated. No evidence of tricuspid stenosis. Aortic Valve: The aortic valve is tricuspid. Aortic valve regurgitation is mild. Aortic regurgitation PHT measures 567 msec. Mild aortic valve sclerosis is present, with no evidence of aortic valve stenosis. Pulmonic Valve: The pulmonic valve was normal in structure. Pulmonic valve regurgitation is not visualized. No evidence of pulmonic stenosis. Aorta: The aortic root is normal in size and structure. Venous: The inferior vena cava is normal in size with greater than 50% respiratory variability, suggesting right atrial pressure of 3 mmHg. IAS/Shunts: No atrial level shunt detected by color flow Doppler.  LEFT VENTRICLE PLAX 2D LVIDd:         4.30 cm  Diastology LVIDs:         2.70 cm  LV e' lateral:   8.93 cm/s LV PW:         1.00 cm  LV E/e' lateral: 11.4 LV IVS:        1.00 cm  LV e' medial:    6.06 cm/s LVOT diam:     1.70 cm  LV E/e' medial:  16.8 LV SV:         56 LV SV Index:   35 LVOT Area:     2.27 cm  RIGHT VENTRICLE RV S prime:     9.31 cm/s TAPSE (M-mode): 1.5 cm LEFT  ATRIUM             Index       RIGHT ATRIUM           Index LA diam:        4.70 cm 2.90 cm/m  RA Area:     11.10 cm LA Vol (A2C):   46.0 ml 28.36 ml/m RA Volume:   23.00 ml  14.18 ml/m LA Vol (A4C):   42.2 ml 26.02 ml/m LA Biplane Vol: 44.2 ml 27.25 ml/m  AORTIC VALVE LVOT Vmax:   112.00 cm/s LVOT Vmean:  74.100 cm/s LVOT VTI:    0.247 m AI PHT:      567 msec  AORTA Ao Root diam: 3.10 cm MITRAL VALVE MV Area (PHT): 3.31 cm     SHUNTS MV Decel Time: 229 msec     Systemic VTI:  0.25 m MV E velocity: 102.00 cm/s  Systemic Diam: 1.70 cm MV A velocity: 91.70 cm/s MV E/A ratio:  1.11 Fransico Him MD Electronically signed by Fransico Him MD Signature Date/Time: 04/16/2020/2:51:08 PM    Final     Microbiology: Recent Results (from the past 240 hour(s))  SARS Coronavirus 2 by RT PCR (hospital order, performed in [place] hospital lab) Nasopharyngeal Nasopharyngeal Swab     Status: None   Collection Time: 04/15/20 11:38 PM   Specimen: Nasopharyngeal Swab  Result Value Ref Range Status   SARS Coronavirus 2 NEGATIVE NEGATIVE Final    Comment: (NOTE) SARS-CoV-2 target nucleic acids are NOT DETECTED. The SARS-CoV-2 RNA is generally detectable in upper and lower respiratory specimens during the acute phase of infection. The lowest concentration of SARS-CoV-2 viral copies this assay can detect is 250 copies / mL. A negative result does not preclude SARS-CoV-2 infection and should not be used as the sole basis for treatment or other patient management decisions.  A negative result may occur with improper specimen collection / handling, submission of specimen other than nasopharyngeal swab, presence  of viral mutation(s) within the areas targeted by this assay, and inadequate number of viral copies (<250 copies / mL). A negative result must be combined with clinical observations, patient history, and epidemiological information. Fact Sheet for Patients:    StrictlyIdeas.no Fact Sheet for Healthcare Providers: BankingDealers.co.za This test is not yet approved or cleared  by the [place] FDA and has been authorized for detection and/or diagnosis of SARS-CoV-2 by FDA under an Emergency Use Authorization (EUA).  This EUA will remain in effect (meaning this test can be used) for the duration of the COVID-19 declaration under Section 564(b)(1) of the Act, 21 U.S.C. section 360bbb-3(b)(1), unless the authorization is terminated or revoked sooner. Performed at [facility], [place] [address], [place], [place] [address]   Culture, blood (routine x 2)     Status: None (Preliminary result)   Collection Time: 04/16/20  5:01 AM   Specimen: BLOOD RIGHT HAND  Result Value Ref Range Status   Specimen Description BLOOD RIGHT HAND  Final   Special Requests   Final    BOTTLES DRAWN AEROBIC AND ANAEROBIC Blood Culture adequate volume Performed at [facility] [address] [place], [place] [address]    Culture   Final    NO GROWTH 2 DAYS Performed at [facility] [address] [place], [place] [address]    Report Status PENDING  Incomplete  Culture, blood (routine x 2)     Status: None (Preliminary result)   Collection Time: 04/16/20  5:03 AM   Specimen: BLOOD  Result Value Ref Range Status   Specimen Description   Final    BLOOD RIGHT ANTECUBITAL Performed at [facility] [address], [place], [place] [address]    Special Requests   Final    BOTTLES DRAWN AEROBIC AND ANAEROBIC Blood Culture adequate volume Performed at [facility] [address], [place], [place] [address]    Culture   Final    NO GROWTH 2 DAYS Performed at [facility] [address] [place], [place] [address]    Report Status PENDING  Incomplete  Surgical pcr screen     Status: None   Collection Time: 04/16/20  7:46 PM    Specimen: Nasal Mucosa; Nasal Swab  Result Value Ref Range Status   MRSA, PCR NEGATIVE NEGATIVE Final   Staphylococcus aureus NEGATIVE NEGATIVE Final    Comment: (NOTE) The Xpert SA Assay (FDA approved for NASAL specimens in patients 33 years of age and older), is one component of a comprehensive surveillance program. It is not intended to diagnose infection nor to guide or monitor treatment. Performed at [facility], [place] [address] [place], [place] [address]      Labs: Basic Metabolic Panel: Recent Labs  Lab 04/15/20 2338 04/16/20 1151 04/17/20 0427 04/18/20 0418  NA 131*  --  140 136  K 4.5  --  4.2 4.3  CL 97*  --  108 103  CO2 25  --  26 26  GLUCOSE 420*  --  121* 234*  BUN 75*  --  46* 39*  CREATININE 3.12* 2.73* 2.46* 2.64*  CALCIUM 8.2*  --  8.3* 8.2*   Liver Function Tests: Recent Labs  Lab 04/15/20 2338 04/17/20 0427  AST 14* 13*  ALT 25 20  ALKPHOS 82 46  BILITOT 0.4 0.3  PROT 6.1* 5.6*  ALBUMIN 3.3* 2.8*   Recent Labs  Lab 04/15/20 2338  LIPASE  24   No results for input(s): AMMONIA in the last 168 hours. CBC: Recent Labs  Lab 04/15/20 2338 04/16/20 1151 04/17/20 0427  WBC 18.8* 17.9* 13.6*  NEUTROABS 16.4*  --   --   HGB 9.7* 9.9* 9.8*  HCT 28.7* 30.3* 30.7*  MCV 88.3 89.9 90.3  PLT 120* 119* 129*    CBG: Recent Labs  Lab 04/17/20 1058 04/17/20 1147 04/17/20 1728 04/18/20 0031 04/18/20 0606  GLUCAP 81 166* 367* 316* 200*       Signed:  Oswald Hillock MD.  [facility] 04/18/2020, 9:47 AM

## 2020-04-18 NOTE — Progress Notes (Signed)
1 Day Post-Op   Subjective/Chief Complaint: Doing well.  No n/v.  + flatus.  Tolerated regular diet.  Did not request any pain meds or nausea meds.     Objective: Vital signs in last 24 hours: Temp:  [97.7 F (36.5 C)-98.8 F (37.1 C)] 97.9 F (36.6 C) (05/16 0609) Pulse Rate:  [60-78] 65 (05/16 0609) Resp:  [10-20] 18 (05/16 0609) BP: (119-184)/(47-66) 168/55 (05/16 0609) SpO2:  [95 %-100 %] 97 % (05/16 0609)    Intake/Output from previous day: 05/15 0701 - 05/16 0700 In: 1525.5 [P.O.:720; I.V.:613.5; IV Piggyback:192] Out: 10 [Blood:10] Intake/Output this shift: Total I/O In: 240 [P.O.:240] Out: -   General appearance: alert, cooperative, no distress and ambulating easily GI: soft, sl distended, non tender.    Lab Results:  Recent Labs    04/16/20 1151 04/17/20 0427  WBC 17.9* 13.6*  HGB 9.9* 9.8*  HCT 30.3* 30.7*  PLT 119* 129*   BMET Recent Labs    04/17/20 0427 04/18/20 0418  NA 140 136  K 4.2 4.3  CL 108 103  CO2 26 26  GLUCOSE 121* 234*  BUN 46* 39*  CREATININE 2.46* 2.64*  CALCIUM 8.3* 8.2*   PT/INR No results for input(s): LABPROT, INR in the last 72 hours. ABG Recent Labs    04/15/20 2338  HCO3 25.4    Studies/Results: ECHOCARDIOGRAM COMPLETE  Result Date: 04/16/2020    ECHOCARDIOGRAM REPORT   Patient Name:   Caleb Barrera Date of Exam: 04/16/2020 Medical Rec #:  [id number]                  Height: Accession #:    [id number]                 Weight:       144.0 lb Date of Birth:  14-Jan-1951                   BSA:          1.622 m Patient Age:    69 years                   BP:           99/82 mmHg Patient Gender: M                          HR:           68 bpm. Exam Location:  Inpatient Procedure: 2D Echo Indications:    CAD Native Vessel 414.01 / I25.10  History:        Patient has no prior history of Echocardiogram examinations.                 Risk Factors:Diabetes. CKD                 Sepsis.  Sonographer:    Vikki Ports Turrentine  Referring Phys: [id number] [place]  1. Left ventricular ejection fraction, by estimation, is 65 to 70%. The left ventricle has normal function. The left ventricle has no regional wall motion abnormalities. Left ventricular diastolic parameters are indeterminate. Elevated left ventricular end-diastolic pressure.  2. Right ventricular systolic function is normal. The right ventricular size is normal. Tricuspid regurgitation signal is inadequate for assessing PA pressure.  3. The mitral valve is normal in structure. Mild mitral valve regurgitation. No evidence of mitral stenosis.  4. The aortic valve is tricuspid. Aortic valve regurgitation  is mild. Mild aortic valve sclerosis is present, with no evidence of aortic valve stenosis.  5. The inferior vena cava is normal in size with greater than 50% respiratory variability, suggesting right atrial pressure of 3 mmHg. FINDINGS  Left Ventricle: Left ventricular ejection fraction, by estimation, is 65 to 70%. The left ventricle has normal function. The left ventricle has no regional wall motion abnormalities. The left ventricular internal cavity size was normal in size. There is  no left ventricular hypertrophy. Left ventricular diastolic parameters are indeterminate. Elevated left ventricular end-diastolic pressure. Right Ventricle: The right ventricular size is normal. No increase in right ventricular wall thickness. Right ventricular systolic function is normal. Tricuspid regurgitation signal is inadequate for assessing PA pressure. Left Atrium: Left atrial size was normal in size. Right Atrium: Right atrial size was normal in size. Pericardium: There is no evidence of pericardial effusion. Mitral Valve: The mitral valve is normal in structure. Normal mobility of the mitral valve leaflets. Mild mitral valve regurgitation. No evidence of mitral valve stenosis. Tricuspid Valve: The tricuspid valve is normal in structure. Tricuspid valve regurgitation is  not demonstrated. No evidence of tricuspid stenosis. Aortic Valve: The aortic valve is tricuspid. Aortic valve regurgitation is mild. Aortic regurgitation PHT measures 567 msec. Mild aortic valve sclerosis is present, with no evidence of aortic valve stenosis. Pulmonic Valve: The pulmonic valve was normal in structure. Pulmonic valve regurgitation is not visualized. No evidence of pulmonic stenosis. Aorta: The aortic root is normal in size and structure. Venous: The inferior vena cava is normal in size with greater than 50% respiratory variability, suggesting right atrial pressure of 3 mmHg. IAS/Shunts: No atrial level shunt detected by color flow Doppler.  LEFT VENTRICLE PLAX 2D LVIDd:         4.30 cm  Diastology LVIDs:         2.70 cm  LV e' lateral:   8.93 cm/s LV PW:         1.00 cm  LV E/e' lateral: 11.4 LV IVS:        1.00 cm  LV e' medial:    6.06 cm/s LVOT diam:     1.70 cm  LV E/e' medial:  16.8 LV SV:         56 LV SV Index:   35 LVOT Area:     2.27 cm  RIGHT VENTRICLE RV S prime:     9.31 cm/s TAPSE (M-mode): 1.5 cm LEFT ATRIUM             Index       RIGHT ATRIUM           Index LA diam:        4.70 cm 2.90 cm/m  RA Area:     11.10 cm LA Vol (A2C):   46.0 ml 28.36 ml/m RA Volume:   23.00 ml  14.18 ml/m LA Vol (A4C):   42.2 ml 26.02 ml/m LA Biplane Vol: 44.2 ml 27.25 ml/m  AORTIC VALVE LVOT Vmax:   112.00 cm/s LVOT Vmean:  74.100 cm/s LVOT VTI:    0.247 m AI PHT:      567 msec  AORTA Ao Root diam: 3.10 cm MITRAL VALVE MV Area (PHT): 3.31 cm     SHUNTS MV Decel Time: 229 msec     Systemic VTI:  0.25 m MV E velocity: 102.00 cm/s  Systemic Diam: 1.70 cm MV A velocity: 91.70 cm/s MV E/A ratio:  1.11 Fransico Him MD Electronically signed by Tressia Miners  Turner MD Signature Date/Time: 04/16/2020/2:51:08 PM    Final     Anti-infectives: Anti-infectives (From admission, onward)   Start     Dose/Rate Route Frequency Ordered Stop   04/16/20 1000  piperacillin-tazobactam (ZOSYN) IVPB 3.375 g     3.375 g 12.5  mL/hr over 240 Minutes Intravenous Every 8 hours 04/16/20 0846     04/16/20 0845  piperacillin-tazobactam (ZOSYN) IVPB 3.375 g  Status:  Discontinued     3.375 g 100 mL/hr over 30 Minutes Intravenous Every 8 hours 04/16/20 0842 04/16/20 0846   04/16/20 0430  piperacillin-tazobactam (ZOSYN) IVPB 3.375 g     3.375 g 100 mL/hr over 30 Minutes Intravenous  Once 04/16/20 0419 04/16/20 0539   04/16/20 0400  piperacillin-tazobactam (ZOSYN) IVPB 2.25 g  Status:  Discontinued     2.25 g 100 mL/hr over 30 Minutes Intravenous Every 6 hours 04/16/20 0356 04/16/20 0429      Assessment/Plan: s/p Procedure(s): APPENDECTOMY LAPAROSCOPIC (N/A) Ok for d/c from surgery standpoint.    Dressings can come off tomorrow.   Ok to shower tomorrow Does not need antibiotics. Hydrocodone sent to local pharmacy.   LOS: 2 days    Stark Klein 04/18/2020

## 2020-04-18 NOTE — Discharge Instructions (Addendum)
[facility] ______[facility] LAPAROSCOPIC SURGERY: POST OP INSTRUCTIONS Always review your discharge instruction sheet given to you by the facility where your surgery was performed. IF YOU HAVE DISABILITY OR FAMILY LEAVE FORMS, YOU MUST BRING THEM TO THE OFFICE FOR PROCESSING.   DO NOT GIVE THEM TO YOUR DOCTOR.  1. A prescription for pain medication may be given to you upon discharge.  Take your pain medication as prescribed, if needed.  If narcotic pain medicine is not needed, then you may take acetaminophen (Tylenol) or ibuprofen (Advil) as needed. 2. Take your usually prescribed medications unless otherwise directed. 3. If you need a refill on your pain medication, please contact your pharmacy.  They will contact our office to request authorization. Prescriptions will not be filled after 5pm or on week-ends. 4. You should follow a light diet the first few days after arrival home, such as soup and crackers, etc.  Be sure to include lots of fluids daily. 5. Most patients will experience some swelling and bruising in the area of the incisions.  Ice packs will help.  Swelling and bruising can take several days to resolve.  6. It is common to experience some constipation if taking pain medication after surgery.  Increasing fluid intake and taking a stool softener (such as Colace) will usually help or prevent this problem from occurring.  A mild laxative (Milk of Magnesia or Miralax) should be taken according to package instructions if there are no bowel movements after 48 hours. 7. Unless discharge instructions indicate otherwise, you may remove your bandages 24-48 hours after surgery, and you may shower at that time.  You may have steri-strips (small skin tapes) in place directly over the incision.  These strips should be left on the skin for 7-10 days.  If your surgeon used skin glue on the incision, you may shower in 24 hours.  The glue will flake off over the next 2-3 weeks.  Any sutures or  staples will be removed at the office during your follow-up visit. 8. ACTIVITIES:  You may resume regular (light) daily activities beginning the next day--such as daily self-care, walking, climbing stairs--gradually increasing activities as tolerated.  You may have sexual intercourse when it is comfortable.  Refrain from any heavy lifting or straining for 2 weeks. a. You may drive when you are no longer taking prescription pain medication, you can comfortably wear a seatbelt, and you can safely maneuver your car and apply brakes. 9. RETURN TO WORK:  _________2 weeks if applicable if lifting is involved.   10. You should see your doctor in the office for a follow-up appointment approximately 2-3 weeks after your surgery.  Make sure that you call for this appointment within a day or two after you arrive home to insure a convenient appointment time. 11. OTHER INSTRUCTIONS: __________________________________________________________________________________________________________________________ __________________________________________________________________________________________________________________________ WHEN TO CALL YOUR DOCTOR: 1. Fever over 101.0 2. Inability to urinate 3. Continued bleeding from incision. 4. Increased pain, redness, or drainage from the incision. 5. Increasing abdominal pain  The clinic staff is available to answer your questions during regular business hours.  Please don't hesitate to call and ask to speak to one of the nurses for clinical concerns.  If you have a medical emergency, go to the nearest emergency room or call 911.  A surgeon from [facility] is always on call at the hospital. [address] [place], [place], [place]  [address] ? [address], [place], [place]   [address] [phone] ?  [phone] ? FAX [phone] Web site: [email or web address]   Apendicitis, en los adultos Appendicitis, Adult  La apendicitis es la  inflamacin del apndice. El apndice es un tubo cilndrico que tiene forma de dedo y est unido al intestino grueso. Si la apendicitis no se trata, puede causar el desgarro (ruptura) del apndice. La ruptura del apndice puede derivar en una infeccin potencialmente mortal. Adems, puede causar la formacin de una acumulacin dolorosa de pus (absceso) en el apndice. Cules son las causas? Esta afeccin puede deberse a una obstruccin en el apndice que produce una infeccin. La obstruccin puede estar causada por:  Una masa de materia fecal (heces).  Agrandamiento de los ganglios linfticos. En algunos casos, es posible que la causa se desconozca. Qu incrementa el riesgo? La edad es un factor de riesgo. Es ms probable que presente esta afeccin si tiene entre 10 y 78 aos de edad. Cules son los signos o los sntomas? Los sntomas de esta afeccin incluyen:  Dolor que comienza alrededor del ombligo y se desplaza hacia parte inferior derecha del abdomen. El dolor puede volverse cada vez ms intenso a medida que pasa el [place]. Empeora al toser o al realizar movimientos bruscos.  Dolor a la palpacin en la zona inferior derecha del abdomen.  Nuseas.  Vmitos.  Prdida del apetito.  Cristy Hilts.  Dificultad para defecar (estreimiento).  Defecar heces muy blandas (diarrea).  Sensacin de Nurse, mental health. Cmo se diagnostica? Esta afeccin puede diagnosticarse en funcin de lo siguiente:  Un examen fsico.  Anlisis de sangre.  Un anlisis de [place]. Para confirmar el diagnstico, se puede hacer una ecografa, una resonancia magntica (RM) o una exploracin por tomografa computarizada (TC). Cmo se trata? Esta afeccin generalmente se trata con ciruga para extirpar el apndice (apendicectoma). Hay dos mtodos para hacer una apendicectoma:  Apendicectoma abierta. En esta ciruga, el apndice se extirpa a travs de una gran incisin que se realiza en la zona inferior  derecha del abdomen. Se puede recomendar este procedimiento si: ? Tiene una cicatriz grande de [place] ciruga anterior. ? Tiene un trastorno de Air cabin crew. ? Est embarazada y est a punto de dar a luz. ? Tiene una afeccin que dificulta la realizacin de Ardelia Mems ciruga a travs de pequeas incisiones (procedimiento laparoscpico). Esto incluye infeccin grave o la ruptura del apndice.  Apendicectoma laparoscpica. En esta ciruga, el apndice se extirpa a travs de pequeas incisiones. Generalmente, este procedimiento causa menos dolor y menos problemas que la apendicectoma abierta. Tambin tiene un tiempo ms corto de recuperacin. Si el apndice se ha roto y se ha formado un absceso:  Es posible que le coloquen un drenaje en el absceso para eliminar el lquido.  Se le podrn administrar antibiticos a travs de una va i.v.  La extirpacin del apndice puede o no ser necesaria. Siga estas indicaciones en su casa: Si se someti a [place], siga las instrucciones del mdico sobre cmo cuidarse en su casa y cmo cuidar la incisin. Medicamentos  Delphi de venta libre y los recetados solamente como se lo haya indicado el mdico.  Si le recetaron un antibitico, tmelo como se lo haya indicado el mdico. No deje de tomar el antibitico aunque comience a sentirse mejor. Comida y bebida  Siga las indicaciones de su mdico respecto de las restricciones para las comidas. Podr reanudar gradualmente una dieta normal una vez que las nuseas y los vmitos desaparezcan. Indicaciones generales  No consuma ningn producto que contenga nicotina o tabaco, como  cigarrillos, cigarrillos electrnicos y tabaco de Higher education careers adviser. Si necesita ayuda para dejar de consumir, consulte al mdico.  No conduzca ni use maquinaria pesada mientras toma analgsicos recetados.  Pregntele al mdico si el medicamento recetado puede causarle estreimiento. Es posible que tenga que tomar medidas para prevenir  o Recruitment consultant, por ejemplo: ? Beba suficiente lquido como para mantener la orina de color amarillo plido. ? Tome medicamentos recetados o de venta [place]. ? Consuma alimentos ricos en fibra, como frijoles, cereales integrales, y frutas y verduras frescas. ? Limite su consumo de alimentos ricos en grasa y azcares procesados, como los alimentos fritos o dulces.  Concurra a todas las visitas de control como se lo haya indicado el mdico. Esto es importante. Comunquese con un mdico si:  Observa pus, sangre o una secrecin excesiva proveniente de la incisin.  Tiene nuseas o vmitos. Solicite ayuda inmediatamente si tiene:  Dolor abdominal que empeora.  Cristy Hilts.  Escalofros.  Fatiga.  Dolores musculares.  Falta de aire. Resumen  La apendicitis es la inflamacin del apndice.  Esta afeccin puede deberse a una obstruccin en el apndice que produce una infeccin.  Esta afeccin generalmente se trata con ciruga para   Apendicectoma laparoscpica en adultos, cuidados posteriores Laparoscopic Appendectomy, Adult, Care After Esta hoja le brinda informacin sobre cmo cuidarse despus del procedimiento. El mdico tambin podr darle indicaciones ms especficas. Si tiene problemas o preguntas, llame al mdico. Qu puedo esperar despus del procedimiento? Despus del procedimiento, es comn [employer] siguientes sntomas: Poca energa para Optometrist las actividades normales. Dolor leve en la zona donde se realizaron los cortes para la ciruga (incisiones). Dificultad para defecar (estreimiento). Esto puede ser consecuencia de: Analgsicos. Falta de [place]. Siga estas indicaciones en su casa: Medicamentos Delphi de venta libre y los recetados solamente como se lo haya indicado el mdico. Si le recetaron un antibitico, tmelo como se lo haya indicado el mdico. No deje de tomarlo aunque comience a sentirse mejor. No conduzca ni use maquinaria  pesada mientras toma analgsicos recetados. Consulte a su mdico si el medicamento que toma puede causarle problemas para defecar. Es posible que deba tomar medidas para prevenir o tratar los problemas para defecar: Beba suficiente lquido para Contractor pis (la orina) de color amarillo plido. Tome medicamentos recetados o de venta [place]. Coma alimentos ricos en fibra. Entre ellos, frijoles, cereales integrales y frutas y verduras frescas. Limite los alimentos con alto contenido de [place] y Location manager. Estos incluyen alimentos fritos o dulces. Cuidado de la incisin  Siga las indicaciones del mdico en lo que respecta al cuidado de los cortes de la [place]. Asegrese de hacer lo siguiente: Lvese las manos con agua y [place] antes y despus de Quarry manager la venda (vendaje). Use un desinfectante para manos si no dispone de [place] y [place]. Cambie las vendas como se lo haya indicado el mdico. No retire los puntos (suturas), la goma para cerrar la piel o las tiras de cinta [place]). Tal vez deban dejarse puestos en la piel durante 2semanas o ms. Si las tiras [place] se despegan y se enroscan, puede recortar los bordes sueltos. No retire las tiras [facility] por completo a menos que el mdico lo autorice. Controle los cortes de la ciruga todos los das para detectar signos de infeccin. Est atento a los siguientes signos: Enrojecimiento, [place]. Lquido o sangre. Calor. Pus o mal olor. Baarse Mantenga los cortes de la ciruga limpios y secos. Lmpielos como se lo haya indicado el  mdico. Para hacer esto: Lave los cortes suavemente con agua y [place]. Enjuague los cortes con agua para quitar todo [place]. Seque bien los cortes con una toalla limpia dando golpecitos. No frote [employer] cortes. No tome baos de inmersin, no nade ni use un jacuzzi durante 2 semanas o hasta que el mdico lo autorice. Puede tomar una ducha despus de 48horas. Actividad  No conduzca durante 24horas si  recibi un medicamento para ayudarlo a relajarse (sedante) durante el procedimiento. Descanse despus del procedimiento. Retome sus actividades habituales como se lo haya indicado el mdico. Pregntele al mdico qu actividades son seguras para usted. Durante 3semanas o el tiempo que le haya indicado el mdico: No levante objetos que pesen ms de 10libras (4,5kg) o que sean ms pesados de lo que le indicaron. No practique deportes de contacto. Indicaciones generales Si lo enviaron a su casa con un drenaje, siga las indicaciones del mdico en lo que respecta al cuidado. Haga respiraciones profundas. Esto ayuda a prevenir una [employer] pulmones (neumona). Concurra a todas las visitas de seguimiento como se lo haya indicado el mdico. Esto es importante. Comunquese con un mdico si: Tiene enrojecimiento, hinchazn o dolor alrededor de un corte de la ciruga. Observa lquido o [employer] de uno de los cortes. El corte est caliente al tacto. Tiene pus o un olor ftido que provienen de uno de los cortes o de un vendaje. Se abren los bordes de uno de los cortes despus de que se retiraron los puntos. Tiene dolor en los hombros que [place]. Se siente mareado o se desvanece (se desmaya). Le falta el aire. Siente malestar estomacal (nuseas) constante. No deja de vomitar. Tiene heces lquidas (diarrea) o no puede controlar la defecacin. Pierde el apetito. Presenta dolor o hinchazn en las piernas. Aparece una erupcin cutnea. Solicite ayuda inmediatamente si: Tiene fiebre. Tiene dificultad para respirar. Siente dolores fuertes [employer]. Resumen Despus del procedimiento, es comn tener poca energa, dolor leve y dificultad para defecar. La infeccin es un problema frecuente despus de este procedimiento. Siga las indicaciones de su mdico acerca de cmo cuidarse despus del procedimiento. Descanse despus del procedimiento. Retome sus actividades habituales como se lo haya  indicado el mdico. Comunquese con su mdico si observa signos de infeccin alrededor [employer] cortes de la ciruga o si le falta el aire. Busque ayuda de inmediato si tiene fiebre, dolor de pecho o problemas para Ambulance person. Esta informacin no tiene Marine scientist el consejo del mdico. Asegrese de hacerle al mdico cualquier pregunta que tenga. Document Revised: 07/22/2018 Document Reviewed: 07/22/2018 Elsevier Patient Education  2020 [place] apndice. Esta informacin no tiene Marine scientist el consejo del mdico. Asegrese de hacerle al mdico cualquier pregunta que tenga. Document Revised: 07/01/2018 Document Reviewed: 07/01/2018 Elsevier Patient Education  [place].

## 2020-04-18 NOTE — Progress Notes (Addendum)
Progress Note  Patient Name: Caleb Barrera Date of Encounter: 04/18/2020  Primary Cardiologist: No primary care provider on file.   Subjective   Underwent appendectomy yesterday, tolerated procedure well.  Denies any chest pain or dyspnea.  Inpatient Medications    Scheduled Meds: . carvedilol  12.5 mg Oral BID WC  . insulin aspart  0-6 Units Subcutaneous Q6H  . levothyroxine  100 mcg Oral QAC breakfast  . metoCLOPramide  10 mg Oral BID  . mirabegron ER  50 mg Oral Daily  . NIFEdipine  30 mg Oral Daily  . pantoprazole  40 mg Oral Daily  . pregabalin  50 mg Oral BID  . rosuvastatin  20 mg Oral Daily  . tamsulosin  0.8 mg Oral Daily   Continuous Infusions: . dextrose 5 % and 0.9% NaCl Stopped (04/17/20 0237)  . famotidine (PEPCID) IV Stopped (04/16/20 1152)  . piperacillin-tazobactam (ZOSYN)  IV 3.375 g (04/18/20 0138)   PRN Meds: acetaminophen **OR** acetaminophen, hydrALAZINE, HYDROcodone-acetaminophen, morphine injection, ondansetron **OR** ondansetron (ZOFRAN) IV, senna-docusate   Vital Signs    Vitals:   04/17/20 1736 04/17/20 2108 04/18/20 0229 04/18/20 0609  BP: (!) 121/47 (!) 119/54 (!) 140/54 (!) 168/55  Pulse: 71 78 68 65  Resp: 16 20 17 18   Temp: 98.2 F (36.8 C) 98.8 F (37.1 C) 98.6 F (37 C) 97.9 F (36.6 C)  TempSrc: Oral Oral Oral Oral  SpO2: 96% 95% 96% 97%  Weight:        Intake/Output Summary (Last 24 hours) at 04/18/2020 0636 Last data filed at 04/18/2020 0600 Gross per 24 hour  Intake 1525.51 ml  Output 10 ml  Net 1515.51 ml   Last 3 Weights 04/16/2020  Weight (lbs) 144 lb  Weight (kg) 65.318 kg      Telemetry    NSR 60-70s - Personally Reviewed  ECG    No new ECG - Personally Reviewed  Physical Exam   GEN: No acute distress.   Neck: No JVD Cardiac: RRR, no murmurs, rubs, or gallops.  Respiratory: Clear to auscultation bilaterally. GI: non-distended  MS: No edema Neuro:  Nonfocal  Psych: Normal affect    Labs    High Sensitivity Troponin:  No results for input(s): TROPONINIHS in the last 720 hours.    Chemistry Recent Labs  Lab 04/15/20 2338 04/15/20 2338 04/16/20 1151 04/17/20 0427 04/18/20 0418  NA 131*  --   --  140 136  K 4.5  --   --  4.2 4.3  CL 97*  --   --  108 103  CO2 25  --   --  26 26  GLUCOSE 420*  --   --  121* 234*  BUN 75*  --   --  46* 39*  CREATININE 3.12*   < > 2.73* 2.46* 2.64*  CALCIUM 8.2*  --   --  8.3* 8.2*  PROT 6.1*  --   --  5.6*  --   ALBUMIN 3.3*  --   --  2.8*  --   AST 14*  --   --  13*  --   ALT 25  --   --  20  --   ALKPHOS 82  --   --  46  --   BILITOT 0.4  --   --  0.3  --   GFRNONAA 19*   < > 23* 26* 23*  GFRAA 22*   < > 26* 30* 27*  ANIONGAP 9  --   --  6 7   < > = values in this interval not displayed.     Hematology Recent Labs  Lab 04/15/20 2338 04/16/20 1151 04/17/20 0427  WBC 18.8* 17.9* 13.6*  RBC 3.25* 3.37* 3.40*  HGB 9.7* 9.9* 9.8*  HCT 28.7* 30.3* 30.7*  MCV 88.3 89.9 90.3  MCH 29.8 29.4 28.8  MCHC 33.8 32.7 31.9  RDW 12.0 12.2 12.4  PLT 120* 119* 129*    BNPNo results for input(s): BNP, PROBNP in the last 168 hours.   DDimer No results for input(s): DDIMER in the last 168 hours.   Radiology    ECHOCARDIOGRAM COMPLETE  Result Date: 04/16/2020    ECHOCARDIOGRAM REPORT   Patient Name:   PAITON FOSCO Date of Exam: 04/16/2020 Medical Rec #:  [id number]                  Height: Accession #:    [id number]                 Weight:       144.0 lb Date of Birth:  1951/07/04                   BSA:          1.622 m Patient Age:    23 years                   BP:           99/82 mmHg Patient Gender: M                          HR:           68 bpm. Exam Location:  Inpatient Procedure: 2D Echo Indications:    CAD Native Vessel 414.01 / I25.10  History:        Patient has no prior history of Echocardiogram examinations.                 Risk Factors:Diabetes. CKD                 Sepsis.  Sonographer:    Vikki Ports  T[place] Referring Phys: [id number] [place]  1. Left ventricular ejection fraction, by estimation, is 65 to 70%. The left ventricle has normal function. The left ventricle has no regional wall motion abnormalities. Left ventricular diastolic parameters are indeterminate. Elevated left ventricular end-diastolic pressure.  2. Right ventricular systolic function is normal. The right ventricular size is normal. Tricuspid regurgitation signal is inadequate for assessing PA pressure.  3. The mitral valve is normal in structure. Mild mitral valve regurgitation. No evidence of mitral stenosis.  4. The aortic valve is tricuspid. Aortic valve regurgitation is mild. Mild aortic valve sclerosis is present, with no evidence of aortic valve stenosis.  5. The inferior vena cava is normal in size with greater than 50% respiratory variability, suggesting right atrial pressure of 3 mmHg. FINDINGS  Left Ventricle: Left ventricular ejection fraction, by estimation, is 65 to 70%. The left ventricle has normal function. The left ventricle has no regional wall motion abnormalities. The left ventricular internal cavity size was normal in size. There is  no left ventricular hypertrophy. Left ventricular diastolic parameters are indeterminate. Elevated left ventricular end-diastolic pressure. Right Ventricle: The right ventricular size is normal. No increase in right ventricular wall thickness. Right ventricular systolic function is normal. Tricuspid regurgitation signal is inadequate for assessing PA pressure. Left Atrium: Left  atrial size was normal in size. Right Atrium: Right atrial size was normal in size. Pericardium: There is no evidence of pericardial effusion. Mitral Valve: The mitral valve is normal in structure. Normal mobility of the mitral valve leaflets. Mild mitral valve regurgitation. No evidence of mitral valve stenosis. Tricuspid Valve: The tricuspid valve is normal in structure. Tricuspid valve  regurgitation is not demonstrated. No evidence of tricuspid stenosis. Aortic Valve: The aortic valve is tricuspid. Aortic valve regurgitation is mild. Aortic regurgitation PHT measures 567 msec. Mild aortic valve sclerosis is present, with no evidence of aortic valve stenosis. Pulmonic Valve: The pulmonic valve was normal in structure. Pulmonic valve regurgitation is not visualized. No evidence of pulmonic stenosis. Aorta: The aortic root is normal in size and structure. Venous: The inferior vena cava is normal in size with greater than 50% respiratory variability, suggesting right atrial pressure of 3 mmHg. IAS/Shunts: No atrial level shunt detected by color flow Doppler.  LEFT VENTRICLE PLAX 2D LVIDd:         4.30 cm  Diastology LVIDs:         2.70 cm  LV e' lateral:   8.93 cm/s LV PW:         1.00 cm  LV E/e' lateral: 11.4 LV IVS:        1.00 cm  LV e' medial:    6.06 cm/s LVOT diam:     1.70 cm  LV E/e' medial:  16.8 LV SV:         56 LV SV Index:   35 LVOT Area:     2.27 cm  RIGHT VENTRICLE RV S prime:     9.31 cm/s TAPSE (M-mode): 1.5 cm LEFT ATRIUM             Index       RIGHT ATRIUM           Index LA diam:        4.70 cm 2.90 cm/m  RA Area:     11.10 cm LA Vol (A2C):   46.0 ml 28.36 ml/m RA Volume:   23.00 ml  14.18 ml/m LA Vol (A4C):   42.2 ml 26.02 ml/m LA Biplane Vol: 44.2 ml 27.25 ml/m  AORTIC VALVE LVOT Vmax:   112.00 cm/s LVOT Vmean:  74.100 cm/s LVOT VTI:    0.247 m AI PHT:      567 msec  AORTA Ao Root diam: 3.10 cm MITRAL VALVE MV Area (PHT): 3.31 cm     SHUNTS MV Decel Time: 229 msec     Systemic VTI:  0.25 m MV E velocity: 102.00 cm/s  Systemic Diam: 1.70 cm MV A velocity: 91.70 cm/s MV E/A ratio:  1.11 Fransico Him MD Electronically signed by Fransico Him MD Signature Date/Time: 04/16/2020/2:51:08 PM    Final     Cardiac Studies   TTE 04/16/20: 1. Left ventricular ejection fraction, by estimation, is 65 to 70%. The  left ventricle has normal function. The left ventricle has no  regional  wall motion abnormalities. Left ventricular diastolic parameters are  indeterminate. Elevated left ventricular  end-diastolic pressure.  2. Right ventricular systolic function is normal. The right ventricular  size is normal. Tricuspid regurgitation signal is inadequate for assessing  PA pressure.  3. The mitral valve is normal in structure. Mild mitral valve  regurgitation. No evidence of mitral stenosis.  4. The aortic valve is tricuspid. Aortic valve regurgitation is mild.  Mild aortic valve sclerosis is present, with no evidence  of aortic valve  stenosis.  5. The inferior vena cava is normal in size with greater than 50%  respiratory variability, suggesting right atrial pressure of 3 mmHg.   Patient Profile     69 y.o. male with a PMH of CAD s/p PCI/DES x2 to ostial-proximal RCA in 2014, HTN, HLD, DM type 2 on insulin, CVA, CKD stage 4, and tobacco abuse who was seen initially for preop evaluation prior to appendectomy  Assessment & Plan     CAD s/p PCI/DES x2 to RCA in 2014 : no anginal complaints. Not on aspirin due to recent anemia. TTE showed EF 65-70% - Continue statin - Given his history of stents, and considering stable Hgb, recommend starting daily ASA 81 mg  Chronic diastolic CHF: appears euvolemic. Lasix initially held due to [facility].  Continue to monitor, can restart PO lasix as needed  HTN: BP elevated. On olmesartan prior to admission despite CKD stage 4.  - Agree with holding olmesartan - Continue carvedilol, will increase to 25 mg BID  HLD: no recent LDL on file - Continue statin  CVA: no recent focal neurologic complaints - Continue statin  Appendicitis: s/p appendectomy.  Recovering well  For questions or updates, please contact [place] Please consult [email or web address] for contact info under        Signed, Donato Heinz, MD  04/18/2020, 6:36 AM

## 2020-04-20 LAB — SURGICAL PATHOLOGY

## 2020-04-21 LAB — CULTURE, BLOOD (ROUTINE X 2)
Culture: NO GROWTH
Culture: NO GROWTH
Special Requests: ADEQUATE
Special Requests: ADEQUATE

## 2021-05-04 IMAGING — CT CT ABD-PELV W/O CM
2 of 4 series · 17 of 46 positions shown, 19 images · non-contrast
Comparison: None.

CLINICAL DATA: Acute nonlocalized abdominal pain with fever.

EXAM:
CT ABDOMEN AND PELVIS WITHOUT CONTRAST
TECHNIQUE: Multidetector CT imaging of the abdomen and pelvis was performed
following the standard protocol without IV contrast.

[Series 2: axial st · axial · 0.79mm/px · z∈[+1120,+1500]mm · 14 of 86 slices shown, 16 images]
[im 5/86  soft-tissue]
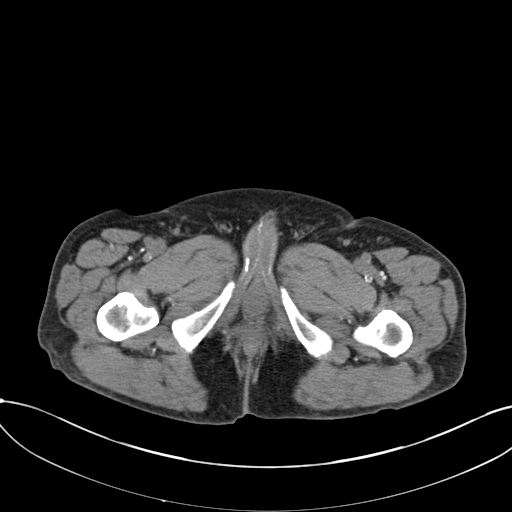
[im 5/86  bone]
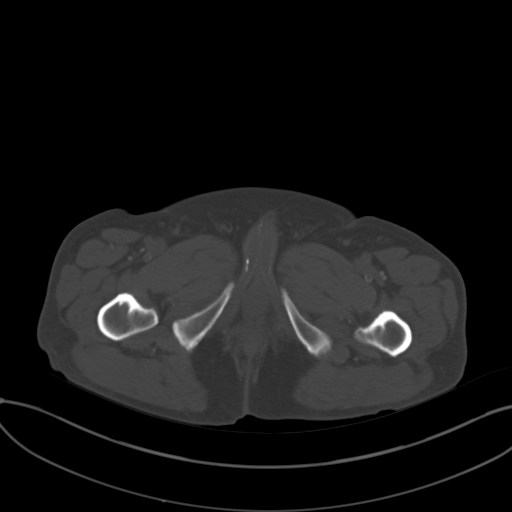
[im 13/86  soft-tissue]
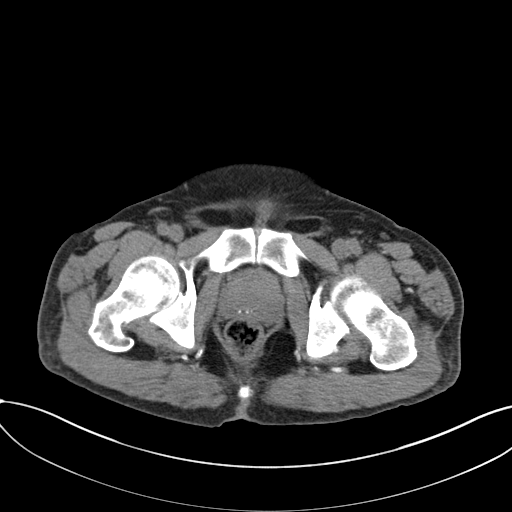
[im 17/86  soft-tissue]
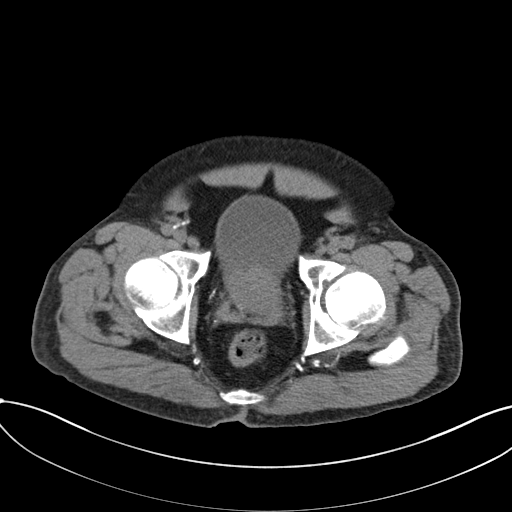
[im 25/86  soft-tissue]
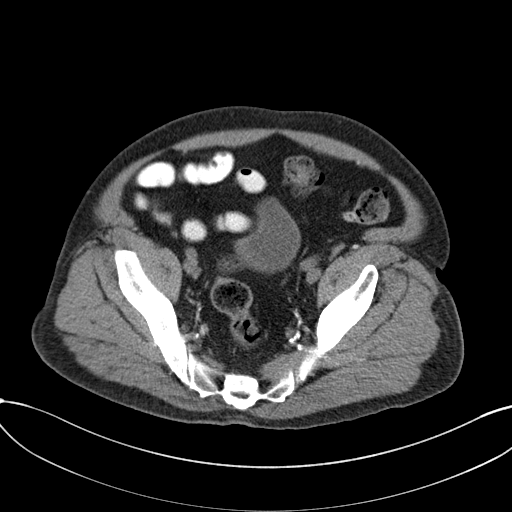
[im 29/86  soft-tissue]
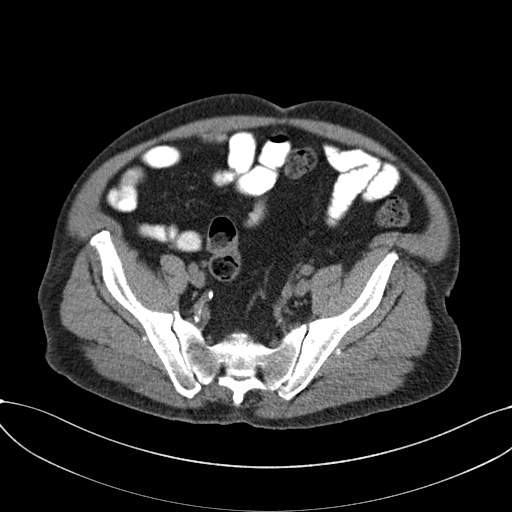
[im 33/86  soft-tissue]
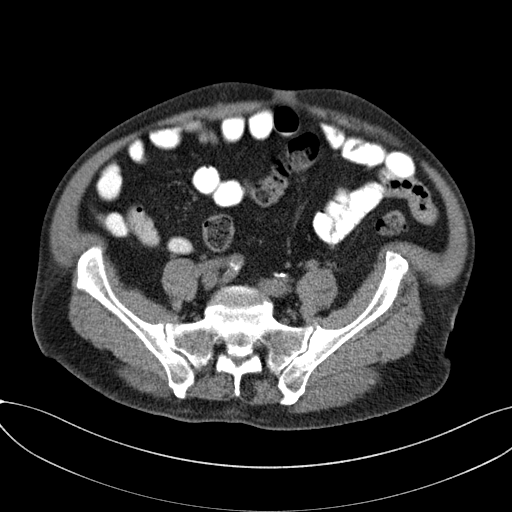
[im 41/86  soft-tissue]
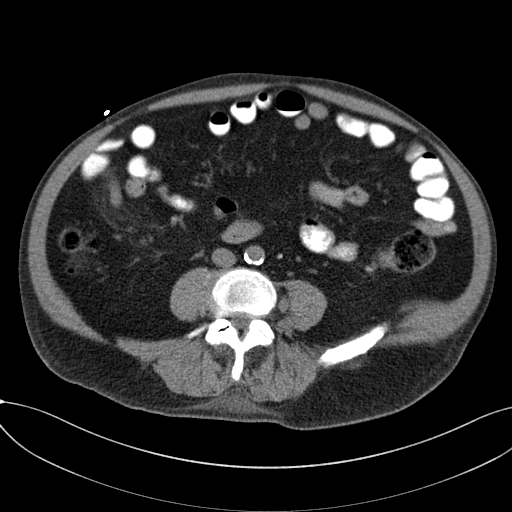
[im 45/86  soft-tissue]
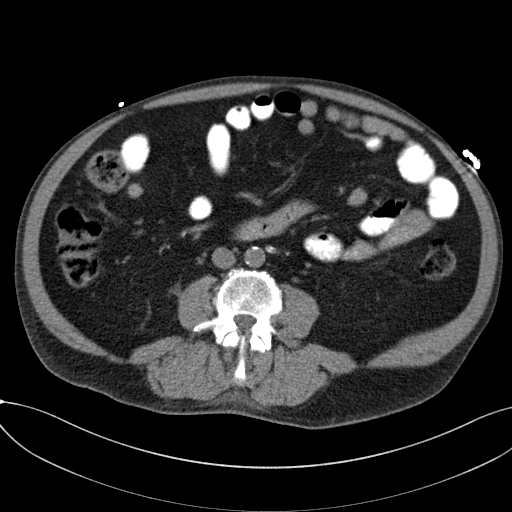
[im 53/86  soft-tissue]
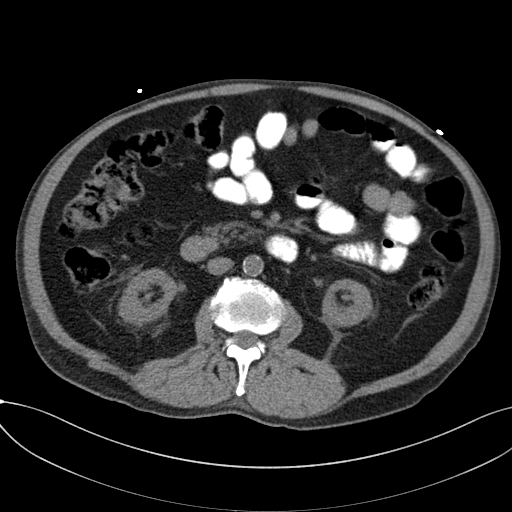
[im 53/86  bone]
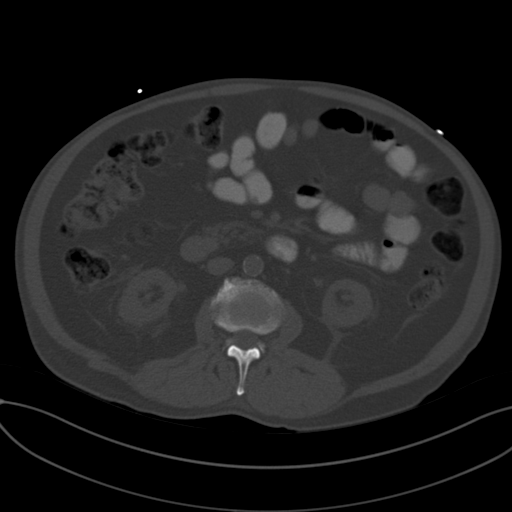
[im 57/86  soft-tissue]
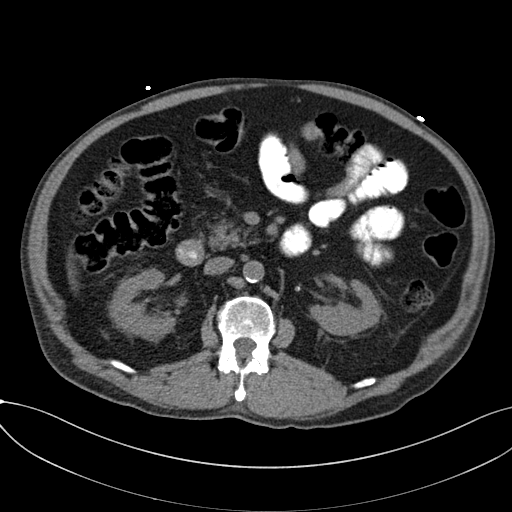
[im 65/86  soft-tissue]
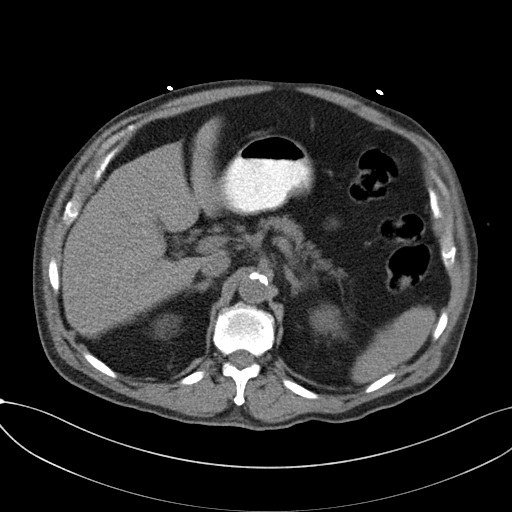
[im 69/86  soft-tissue]
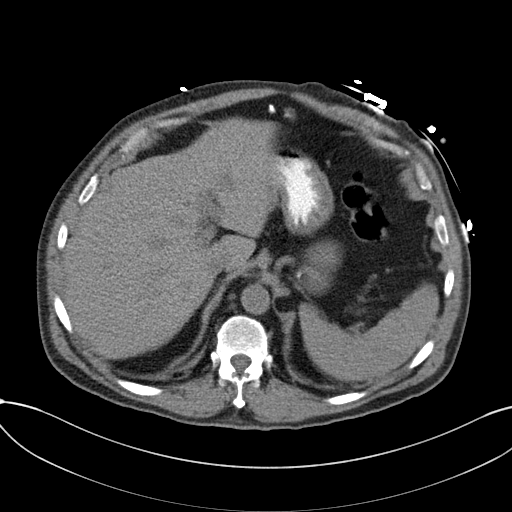
[im 73/86  soft-tissue]
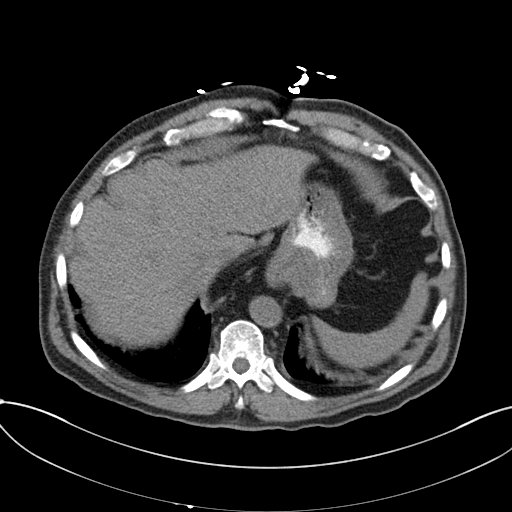
[im 81/86  soft-tissue]
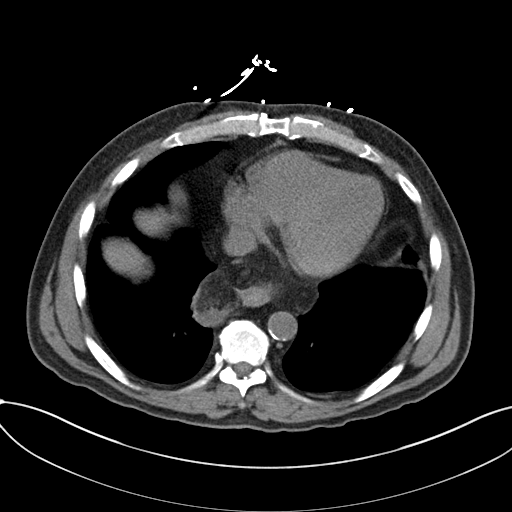

[Series 4: coronal st · coronal · 0.75mm/px · 3 of 151 slices shown]
[im 51/151  soft-tissue]
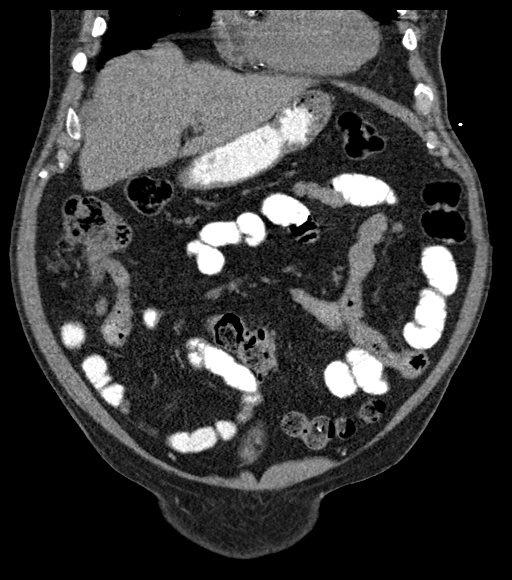
[im 67/151  soft-tissue]
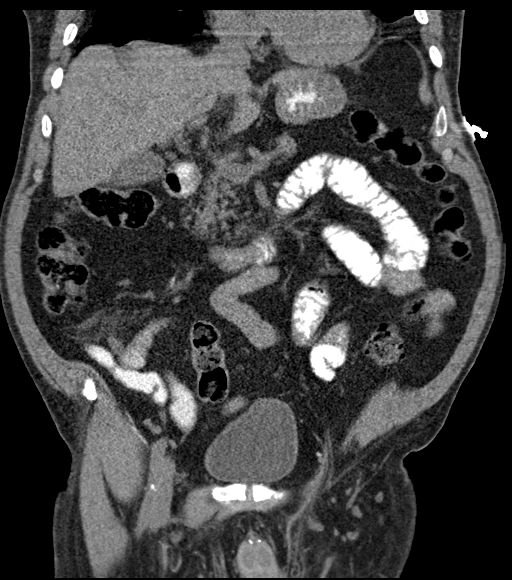
[im 84/151  soft-tissue]
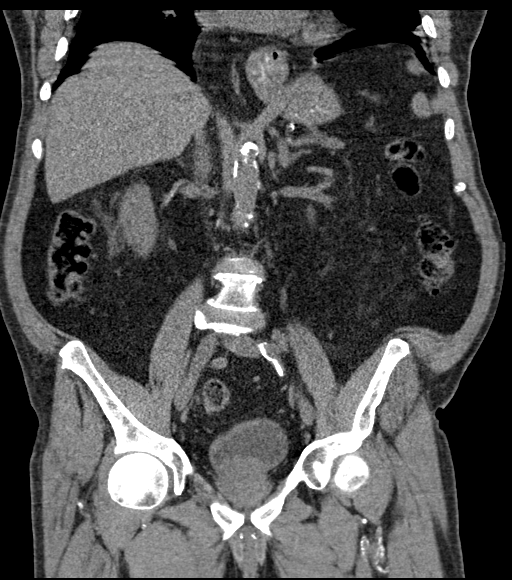

[17 of 46 positions shown; findings below may reference images not displayed]

FINDINGS: Lower chest: Moderate hiatal hernia containing stomach, fat, and
trace ascitic fluid.

Extensive right coronary atherosclerotic calcification.

Hepatobiliary: No focal liver abnormality.Cholelithiasis without
inflammation or ductal dilatation by CT.

Pancreas: Unremarkable.

Spleen: Unremarkable.

Adrenals/Urinary Tract: Negative adrenals. No hydronephrosis or
stone. Symmetric renal atrophy. Unremarkable bladder.

Stomach/Bowel: Thickened appendix with tiny appendicolith on
reformats and mesoappendiceal fat stranding. Outer wall diameter is
12 mm on coronal reformats. The appendix extends inferiorly from the
cecum, which is over the mid right flank.

Mild for age colonic diverticulosis.  No bowel obstruction.

Vascular/Lymphatic: Diffuse atherosclerotic calcification. No mass
or adenopathy.

Reproductive:Symmetric enlargement of the prostate.

Other: No ascites or pneumoperitoneum.

Musculoskeletal: Spondylosis and facet arthropathy.
IMPRESSION: 1. Acute appendicitis with tiny appendicolith. No abscess/visible
perforation.
2. Cholelithiasis.
3.  Aortic Atherosclerosis ([id number]).
